# Patient Record
Sex: Female | Born: 1960
Health system: Southern US, Community
[De-identification: ages and names within clinical notes are randomized; demographics above are authoritative.]

## PROBLEM LIST (undated history)

## (undated) DIAGNOSIS — R7303 Prediabetes: Secondary | ICD-10-CM

## (undated) DIAGNOSIS — K589 Irritable bowel syndrome without diarrhea: Secondary | ICD-10-CM

## (undated) DIAGNOSIS — G4700 Insomnia, unspecified: Secondary | ICD-10-CM

## (undated) DIAGNOSIS — N6019 Diffuse cystic mastopathy of unspecified breast: Secondary | ICD-10-CM

## (undated) DIAGNOSIS — E785 Hyperlipidemia, unspecified: Secondary | ICD-10-CM

## (undated) DIAGNOSIS — K219 Gastro-esophageal reflux disease without esophagitis: Secondary | ICD-10-CM

## (undated) DIAGNOSIS — F418 Other specified anxiety disorders: Secondary | ICD-10-CM

## (undated) DIAGNOSIS — N809 Endometriosis, unspecified: Secondary | ICD-10-CM

## (undated) HISTORY — DX: Endometriosis, unspecified: N80.9

## (undated) HISTORY — DX: Prediabetes: R73.03

## (undated) HISTORY — PX: COLPOSCOPY: SHX161

## (undated) HISTORY — DX: Diffuse cystic mastopathy of unspecified breast: N60.19

## (undated) HISTORY — DX: Insomnia, unspecified: G47.00

## (undated) HISTORY — PX: COLONOSCOPY: SHX174

## (undated) HISTORY — DX: Hyperlipidemia, unspecified: E78.5

## (undated) HISTORY — DX: Irritable bowel syndrome, unspecified: K58.9

## (undated) HISTORY — DX: Other specified anxiety disorders: F41.8

---
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
## 1991-07-04 HISTORY — PX: OTHER SURGICAL HISTORY: SHX169

## 1998-07-03 HISTORY — PX: CERVIX LESION DESTRUCTION: SHX591

## 2000-12-06 ENCOUNTER — Encounter: Payer: Self-pay | Admitting: Gastroenterology

## 2000-12-06 ENCOUNTER — Encounter: Admission: RE | Admit: 2000-12-06 | Discharge: 2000-12-06 | Payer: Self-pay | Admitting: Gastroenterology

## 2000-12-19 ENCOUNTER — Ambulatory Visit (HOSPITAL_COMMUNITY): Admission: RE | Admit: 2000-12-19 | Discharge: 2000-12-19 | Payer: Self-pay | Admitting: Gastroenterology

## 2004-05-02 ENCOUNTER — Encounter: Admission: RE | Admit: 2004-05-02 | Discharge: 2004-05-02 | Payer: Self-pay | Admitting: Unknown Physician Specialty

## 2004-05-16 ENCOUNTER — Ambulatory Visit (HOSPITAL_COMMUNITY): Admission: RE | Admit: 2004-05-16 | Discharge: 2004-05-16 | Payer: Self-pay | Admitting: Family Medicine

## 2006-05-11 ENCOUNTER — Ambulatory Visit (HOSPITAL_COMMUNITY): Admission: RE | Admit: 2006-05-11 | Discharge: 2006-05-11 | Payer: Self-pay | Admitting: Family Medicine

## 2006-07-10 ENCOUNTER — Ambulatory Visit (HOSPITAL_COMMUNITY): Admission: RE | Admit: 2006-07-10 | Discharge: 2006-07-10 | Payer: Self-pay | Admitting: Family Medicine

## 2006-09-11 ENCOUNTER — Ambulatory Visit (HOSPITAL_COMMUNITY): Admission: RE | Admit: 2006-09-11 | Discharge: 2006-09-12 | Payer: Self-pay | Admitting: Orthopedic Surgery

## 2006-10-10 ENCOUNTER — Encounter: Payer: Self-pay | Admitting: Orthopedic Surgery

## 2006-11-01 ENCOUNTER — Encounter: Payer: Self-pay | Admitting: Orthopedic Surgery

## 2006-11-21 ENCOUNTER — Ambulatory Visit (HOSPITAL_BASED_OUTPATIENT_CLINIC_OR_DEPARTMENT_OTHER): Admission: RE | Admit: 2006-11-21 | Discharge: 2006-11-21 | Payer: Self-pay | Admitting: Orthopedic Surgery

## 2006-11-22 ENCOUNTER — Encounter: Payer: Self-pay | Admitting: Orthopedic Surgery

## 2006-12-02 ENCOUNTER — Encounter: Payer: Self-pay | Admitting: Orthopedic Surgery

## 2007-01-01 ENCOUNTER — Encounter: Payer: Self-pay | Admitting: Orthopedic Surgery

## 2007-02-01 ENCOUNTER — Encounter: Payer: Self-pay | Admitting: Orthopedic Surgery

## 2007-05-09 ENCOUNTER — Encounter: Payer: Self-pay | Admitting: Orthopedic Surgery

## 2007-06-03 ENCOUNTER — Encounter: Payer: Self-pay | Admitting: Orthopedic Surgery

## 2007-07-04 ENCOUNTER — Encounter: Payer: Self-pay | Admitting: Orthopedic Surgery

## 2007-07-04 HISTORY — PX: SHOULDER ARTHROSCOPY: SHX128

## 2007-11-19 ENCOUNTER — Ambulatory Visit (HOSPITAL_COMMUNITY): Admission: RE | Admit: 2007-11-19 | Discharge: 2007-11-19 | Payer: Self-pay | Admitting: Orthopedic Surgery

## 2007-11-20 ENCOUNTER — Encounter: Payer: Self-pay | Admitting: Orthopedic Surgery

## 2007-12-02 ENCOUNTER — Encounter: Payer: Self-pay | Admitting: Orthopedic Surgery

## 2008-01-01 ENCOUNTER — Encounter: Payer: Self-pay | Admitting: Orthopedic Surgery

## 2008-01-28 ENCOUNTER — Ambulatory Visit (HOSPITAL_COMMUNITY): Admission: RE | Admit: 2008-01-28 | Discharge: 2008-01-28 | Payer: Self-pay | Admitting: Family Medicine

## 2008-01-29 ENCOUNTER — Emergency Department (HOSPITAL_COMMUNITY): Admission: EM | Admit: 2008-01-29 | Discharge: 2008-01-29 | Payer: Self-pay | Admitting: Emergency Medicine

## 2008-07-24 ENCOUNTER — Ambulatory Visit (HOSPITAL_COMMUNITY): Admission: RE | Admit: 2008-07-24 | Discharge: 2008-07-24 | Payer: Self-pay | Admitting: Family Medicine

## 2010-01-31 ENCOUNTER — Emergency Department (HOSPITAL_COMMUNITY): Admission: EM | Admit: 2010-01-31 | Discharge: 2010-01-31 | Payer: Self-pay | Admitting: Emergency Medicine

## 2010-06-02 ENCOUNTER — Emergency Department (HOSPITAL_COMMUNITY)
Admission: EM | Admit: 2010-06-02 | Discharge: 2010-06-02 | Payer: Self-pay | Source: Home / Self Care | Admitting: Emergency Medicine

## 2010-07-24 ENCOUNTER — Encounter: Payer: Self-pay | Admitting: Family Medicine

## 2010-09-16 LAB — POCT CARDIAC MARKERS: Troponin i, poc: <0.05 ng/mL (ref 0.00–0.09)

## 2010-09-16 LAB — CBC
Hemoglobin: 12.6 g/dL (ref 12.0–15.0)
MCH: 32.3 pg (ref 26.0–34.0)
MCHC: 34.3 g/dL (ref 30.0–36.0)
Platelets: ADEQUATE 10*3/uL (ref 150–400)

## 2010-09-16 LAB — DIFFERENTIAL
Basophils Relative: 1 % (ref 0–1)
Eosinophils Absolute: 0.1 10*3/uL (ref 0.0–0.7)
Monocytes Relative: 8 % (ref 3–12)
Neutrophils Relative %: 56 % (ref 43–77)

## 2010-09-16 LAB — BASIC METABOLIC PANEL
BUN: 17 mg/dL (ref 6–23)
CO2: 24 mEq/L (ref 19–32)
Calcium: 9.2 mg/dL (ref 8.4–10.5)
Creatinine, Ser: 0.83 mg/dL (ref 0.4–1.2)
Glucose, Bld: 93 mg/dL (ref 70–99)

## 2010-10-13 ENCOUNTER — Other Ambulatory Visit (HOSPITAL_COMMUNITY): Payer: Self-pay | Admitting: *Deleted

## 2010-10-13 DIAGNOSIS — Z139 Encounter for screening, unspecified: Secondary | ICD-10-CM

## 2010-10-20 ENCOUNTER — Ambulatory Visit (HOSPITAL_COMMUNITY)
Admission: RE | Admit: 2010-10-20 | Discharge: 2010-10-20 | Disposition: A | Discharge status: Home / Self Care | Payer: BC Managed Care – PPO | Source: Ambulatory Visit | Attending: Family Medicine | Admitting: Family Medicine

## 2010-10-20 DIAGNOSIS — Z1231 Encounter for screening mammogram for malignant neoplasm of breast: Secondary | ICD-10-CM | POA: Insufficient documentation

## 2010-10-20 DIAGNOSIS — Z139 Encounter for screening, unspecified: Secondary | ICD-10-CM

## 2010-11-15 NOTE — Op Note (Signed)
[name], [name]              ACCOUNT NO.:  [id number]   MEDICAL RECORD NO.:  [id number]          PATIENT TYPE:  AMB   LOCATION:  [facility]                         FACILITY:  [facility]   PHYSICIAN:  Marlowe Kays, M.D.  DATE OF BIRTH:  [birth year]   DATE OF PROCEDURE:  11/21/2006  DATE OF DISCHARGE:                               OPERATIVE REPORT   PREOPERATIVE DIAGNOSIS:  Adhesive capsulitis right shoulder, status post  right shoulder arthroscopy.   POSTOPERATIVE DIAGNOSIS:  Adhesive capsulitis right shoulder, status  post right shoulder arthroscopy.   OPERATION:  Closed manipulation right shoulder with intra-articular  injection of Depo-Medrol and Marcaine.   SURGEON:  Marlowe Kays, M.D.   ASSISTANT:  Nurse.   ANESTHESIA:  General anesthesia.   PATHOLOGY AND JUSTIFICATION FOR PROCEDURE:  Original surgery was roughly  10 weeks ago.  She has had persistence of significant loss of motion  despite physical therapy.  Preoperatively she had about 50% of normal  motion under anesthetic as described below.   PROCEDURE:  A time-out was performed, satisfactory general anesthesia,  preliminary range of motion under anesthesia was abduction to 45  degrees, flexion to 60, internal rotation to 45 and external rotation to  20.  With gentle stress and audible and palpable lysis of adhesions, I  went through abduction, flexion, internal rotation and external rotation  with the conclusion of the motion being normal with 135 degrees of  abduction, 180 degrees of flexion/extension and 90 degrees of rotation.  I then prepped the subacromial area and injected 10 mL 0.50% Marcaine  plain and 40 mg of Depo-Medrol.  And the time of this dictation, she was  being awakened and on her way to the recovery room in satisfactory  condition with no known complications.           ______________________________  Marlowe Kays, M.D.     JA/MEDQ  D:  11/21/2006  T:  11/21/2006  Job:  [id number]

## 2010-11-15 NOTE — Op Note (Signed)
[name], [name]              ACCOUNT NO.:  [id number]   MEDICAL RECORD NO.:  [id number]          PATIENT TYPE:  AMB   LOCATION:  DAY                          FACILITY:  [facility]   PHYSICIAN:  Marlowe Kays, M.D.  DATE OF BIRTH:  05-Jan-1961   DATE OF PROCEDURE:  DATE OF DISCHARGE:                               OPERATIVE REPORT   PREOPERATIVE DIAGNOSIS:  Adhesive capsulitis, left shoulder.   POSTOPERATIVE DIAGNOSIS:  Adhesive capsulitis, left shoulder.   OPERATION:  Closed manipulation, left shoulder, and subacromial  injection with Marcaine and steroid.   SURGEON:  Marlowe Kays, MD.   ASSISTANT:  None.   ANESTHESIA:  General.   JUSTIFICATION FOR PROCEDURE:  She had a nonsurgical MRI and had  substantial restriction of shoulder motion refractory to physical  therapy and consequently she is here today for the above-mentioned  procedure.   PROCEDURE:  After satisfactory general anesthesia, time-out was  performed.  Preoperative measurements were abduction 45 degrees, flexion  85, internal rotation of 30, and external rotation of 30.  I then  performed gentle but forceful range of motion stressing on her shoulder,  going first in abduction and then to flexion, and then external rotation  and then internal rotation.  Towards the final end point there were some  audible and palpable lysis of adhesions.  Final motion was abduction to  135 degrees, flexion to 180, internal rotation to 75, and external  rotation to 60.  I then prepped the subacromial area of her lateral  shoulder and injected beneath the acromion with 10 mL of 0.5% Marcaine  without adrenaline and 2 mL of Kenalog.  She was then awakened, and  taken to recovery in satisfactory condition with no known complications.           ______________________________  Marlowe Kays, M.D.     JA/MEDQ  D:  11/19/2007  T:  11/19/2007  Job:  [id number]

## 2010-11-18 NOTE — Op Note (Signed)
[name], [name]              ACCOUNT NO.:  [id number]   MEDICAL RECORD NO.:  [id number]          PATIENT TYPE:  OIB   LOCATION:  1504                         FACILITY:  [facility]   PHYSICIAN:  Marlowe Kays, M.D.  DATE OF BIRTH:  02-Mar-1961   DATE OF PROCEDURE:  09/11/2006  DATE OF DISCHARGE:  09/12/2006                               OPERATIVE REPORT   PREOPERATIVE DIAGNOSES:  1. Chronic impingement syndrome with rotator cuff and biceps      tendinopathy.  2. Degenerative arthritis acromioclavicular joint right shoulder.   POSTOPERATIVE DIAGNOSES:  1. Chronic impingement syndrome with rotator cuff tendinopathy.  2. Degenerative arthritis acromioclavicular joint right shoulder.   OPERATION:  1. Right shoulder arthroscopy (normal examination).  2. Arthroscopic subacromial decompression.  3. Open distal clavicle resection.   SURGEON:  Dr. Simonne Come   ASSISTANT:  Mr. Adrian Blackwater.   ANESTHESIA:  General.   PATHOLOGY AND JUSTIFICATION FOR PROCEDURE:  She has had a painful right  shoulder with an MRI demonstrating the above diagnoses.  Her AC joint  was quite tender with spurring on the underneath surface.   PROCEDURE:  Prophylactic antibiotics, satisfactory general anesthesia,  beach-chair position on the Humbird frame.  Right shoulder girdle was  prepped with DuraPrep, draped in a sterile field.  The anatomy of the  shoulder joint was marked out and tentative incision for the distal  clavicle resection, the subacromial space, and posterior and lateral  portals were all infiltrated with 0.5% Marcaine with adrenalin.  Through  a posterior soft spot portal, I atraumatically entered the glenohumeral  joint which was unremarkable on examination.  Documentary pictures were  taken.  I then redirected the scope into the subacromial space.  She had  a good bit of bursal tissue present.  Through a lateral portal, I used a  4.2 shaver to clean out the space.  I then brought in the  ArthroCare 90  degree vaporizer and coagulated some bleeders from the bursal resection  and then began removing soft tissue from the undersurface of the type 2  acromion back to the [facility] joint which was identified arthroscopically.  I  then followed with a 4-mm oval bur, burring down the underneath surface  of the distal acromion and went back and forth between the vaporizer,  the bur, and the 4.2 shaver until we had a wide decompression documented  with films with the arm to her side, arm abducted.  Essentially no  bleeding, and I have also inspected the rotator cuff and took a  representative picture of it.  At this point, I evacuated all fluid  possible from the subacromial space, and we made an open incision over  the distal clavicle, identifying the [facility] joint with a needle and  visually.  I then measured 1-1.5 cm from the [facility] joint medialward,  undermining the clavicle at this point and then placed 2 small Homan's.  With the microsaw, I amputated the clavicle at this point and removed it  with towel clip and cautery technique.  I placed bone wax over the raw  bone.  Minimal bleeders were coagulated  with cautery, and I then placed  Gelfoam in the resection space.  I then closed the soft tissues over the  distal clavicle and resected area with interrupted #1 Vicryl in the  fascial  periosteal area, 2-0 Vicryl subcutaneous tissue, Steri-Strips on the  skin with a 4-0 nylon in the 2 portals.  Dry sterile dressing, shoulder  immobilizer were applied.  She tolerated the procedure well and was  taken to recovery in satisfactory condition with no known complications.           ______________________________  Marlowe Kays, M.D.     JA/MEDQ  D:  09/11/2006  T:  09/13/2006  Job:  [id number]

## 2010-12-22 ENCOUNTER — Ambulatory Visit (INDEPENDENT_AMBULATORY_CARE_PROVIDER_SITE_OTHER): Payer: BC Managed Care – PPO | Admitting: Psychiatry

## 2010-12-22 DIAGNOSIS — F329 Major depressive disorder, single episode, unspecified: Secondary | ICD-10-CM

## 2010-12-22 DIAGNOSIS — F411 Generalized anxiety disorder: Secondary | ICD-10-CM

## 2010-12-28 ENCOUNTER — Encounter (HOSPITAL_COMMUNITY): Payer: BC Managed Care – PPO | Admitting: Psychiatry

## 2010-12-30 ENCOUNTER — Encounter (HOSPITAL_COMMUNITY): Payer: BC Managed Care – PPO | Admitting: Psychiatry

## 2011-03-29 LAB — PREGNANCY, URINE: Preg Test, Ur: NEGATIVE

## 2011-03-31 LAB — URINALYSIS, ROUTINE W REFLEX MICROSCOPIC
Bilirubin Urine: NEGATIVE
Glucose, UA: NEGATIVE
Ketones, ur: NEGATIVE
Protein, ur: NEGATIVE

## 2011-03-31 LAB — POCT I-STAT, CHEM 8
BUN: 16
Chloride: 105
Creatinine, Ser: 0.7
Glucose, Bld: 113 — ABNORMAL HIGH
Potassium: 3.7

## 2012-09-05 ENCOUNTER — Ambulatory Visit (INDEPENDENT_AMBULATORY_CARE_PROVIDER_SITE_OTHER): Payer: BC Managed Care – PPO | Admitting: Otolaryngology

## 2012-09-05 DIAGNOSIS — J342 Deviated nasal septum: Secondary | ICD-10-CM

## 2012-09-05 DIAGNOSIS — J343 Hypertrophy of nasal turbinates: Secondary | ICD-10-CM

## 2012-09-11 ENCOUNTER — Encounter: Payer: Self-pay | Admitting: Gastroenterology

## 2012-09-24 ENCOUNTER — Ambulatory Visit: Payer: BC Managed Care – PPO | Admitting: Gastroenterology

## 2012-10-01 ENCOUNTER — Telehealth: Payer: Self-pay | Admitting: Family Medicine

## 2012-10-01 NOTE — Telephone Encounter (Signed)
Is there a medication for night sweats

## 2012-10-01 NOTE — Telephone Encounter (Signed)
Nurse: 4:24PM Patient: [name]  Pharm: [facility] [place]  MSG: Patient currently having the night sweats / they are not getting any better. Per Dr.Scott she was told to advise him that the sweats are not getting better so he can prescribe something for her.

## 2012-10-02 ENCOUNTER — Encounter: Payer: Self-pay | Admitting: Gastroenterology

## 2012-10-02 ENCOUNTER — Telehealth: Payer: Self-pay | Admitting: Family Medicine

## 2012-10-02 NOTE — Telephone Encounter (Signed)
Patient left voice mail for me that she was seen by Dr. Fransico Him - endocrin on 10/01/12

## 2012-10-03 ENCOUNTER — Ambulatory Visit (INDEPENDENT_AMBULATORY_CARE_PROVIDER_SITE_OTHER): Payer: BC Managed Care – PPO | Admitting: Otolaryngology

## 2012-10-04 NOTE — Telephone Encounter (Signed)
4 menopause related symptoms such as night sweats there are many great choices. What is commonly used his low dose serotonin reuptake inhibitors. Examples of this would be Celexa, Lexapro, Prozac. These would be used and a low dose. That gives mild to moderate relief in a significant portion of/night sweats suffers. Please talk with the patient and see if they would like to try one of these I will be happy to call it in for them.

## 2012-10-07 NOTE — Telephone Encounter (Signed)
Patient stated she is already on Lexapro 10mg  daily with no relief. FYI: Patient said she saw Dr. Fransico Him for diabetes and he increased her metformin to 2000 mg bid but it made her very, very sick and she called his office to get meds changed and will let us know his new changes.

## 2012-10-10 ENCOUNTER — Ambulatory Visit: Payer: BC Managed Care – PPO | Admitting: Gastroenterology

## 2012-10-17 ENCOUNTER — Ambulatory Visit (INDEPENDENT_AMBULATORY_CARE_PROVIDER_SITE_OTHER): Payer: BC Managed Care – PPO | Admitting: Otolaryngology

## 2012-10-21 ENCOUNTER — Encounter: Payer: Self-pay | Admitting: *Deleted

## 2012-10-24 ENCOUNTER — Encounter: Payer: Self-pay | Admitting: *Deleted

## 2012-10-29 ENCOUNTER — Encounter: Payer: Self-pay | Admitting: Gastroenterology

## 2012-10-29 ENCOUNTER — Ambulatory Visit (INDEPENDENT_AMBULATORY_CARE_PROVIDER_SITE_OTHER): Payer: BC Managed Care – PPO | Admitting: Gastroenterology

## 2012-10-29 VITALS — BP 110/62 | HR 64 | Ht 65.0 in | Wt 176.2 lb

## 2012-10-29 DIAGNOSIS — R141 Gas pain: Secondary | ICD-10-CM

## 2012-10-29 DIAGNOSIS — K589 Irritable bowel syndrome without diarrhea: Secondary | ICD-10-CM

## 2012-10-29 DIAGNOSIS — D649 Anemia, unspecified: Secondary | ICD-10-CM

## 2012-10-29 DIAGNOSIS — R140 Abdominal distension (gaseous): Secondary | ICD-10-CM

## 2012-10-29 MED ORDER — LINACLOTIDE 145 MCG PO CAPS: 145.0000 ug | ORAL_CAPSULE | Freq: Every day | ORAL | Status: DC

## 2012-10-29 MED ORDER — MOVIPREP 100 G PO SOLR: 1.0000 | Freq: Once | ORAL | Status: DC

## 2012-10-29 NOTE — Patient Instructions (Addendum)
  You have been scheduled for a colonoscopy with propofol. Please follow written instructions given to you at your visit today.  Please pick up your prep kit at the pharmacy within the next 1-3 days. If you use inhalers (even only as needed), please bring them with you on the day of your procedure. Your physician has requested that you go to www.startemmi.com and enter the access code given to you at your visit today. This web site gives a general overview about your procedure. However, you should still follow specific instructions given to you by our office regarding your preparation for the procedure.  Please follow diabetic instructions as well.  Samples of Linzess was given today. Please take one tablet by mouth once daily. If this works well for you, please call back for a prescription.  FOD-MAP diet given for you to review. ____________________________________________________________________________________________________________________________                                               We are excited to introduce MyChart, a new best-in-class service that provides you online access to important information in your electronic medical record. We want to make it easier for you to view your health information - all in one secure location - when and where you need it. We expect MyChart will enhance the quality of care and service we provide.  When you register for MyChart, you can:    View your test results.    Request appointments and receive appointment reminders via email.    Request medication renewals.    View your medical history, allergies, medications and immunizations.    Communicate with your physician's office through a password-protected site.    Conveniently print information such as your medication lists.  To find out if MyChart is right for you, please talk to a member of our clinical staff today. We will gladly answer your questions about this free health and  wellness tool.  If you are age 52 or older and want a member of your family to have access to your record, you must provide written consent by completing a proxy form available at our office. Please speak to our clinical staff about guidelines regarding accounts for patients younger than age 70.  As you activate your MyChart account and need any technical assistance, please call the MyChart technical support line at [phone]) or email your question to [email or web address]. If you email your question(s), please include your name, a return phone number and the best time to reach you.  If you have non-urgent health-related questions, you can send a message to our office through MyChart at [email or web address]. If you have a medical emergency, call 911.  Thank you for using MyChart as your new health and wellness resource!   MyChart licensed from [employer],  4540-9811. Patents Pending.

## 2012-10-29 NOTE — Progress Notes (Addendum)
History of Present Illness:  This is a very pleasant 52 year old Caucasian female who has a long history of IBS with previous colonoscopy in 2002 showing a very long and redundant colon.  She currently describes intermittent left lower quadrant pain, gas, bloating, excessive flatus, and some difficulty with stool evacuation.  She has mild anemia but iron studies were normal.  Also celiac serologies were obtained by Dr. Lilyan Punt and were negative.  The patient denies any specific food intolerances, and does not use sorbitol or fructose ,or lactose in her diet.  She has gained 35 pounds over the last 2 years.  There is no history of melena, hematochezia, upper GI or hepatobiliary complaints.  She does have mild diabetes and is currently on metformin 500 mg daily, Lexapro 10 mg a day, and Pravachol 20 mg a day.  Family history is noncontributory.  The patient relates she's had regular gynecologic exams which have been unremarkable.  She is postmenopausal.   I have reviewed this patient's present history, medical and surgical past history, allergies and medications.     ROS:     Physical Exam: Blood pressure 110/62, pulse 64 and regular and weight 176 with a BMI of 29.32. General well developed well nourished patient in no acute distress, appearing their stated age Eyes PERRLA, no icterus, fundoscopic exam per opthamologist Skin no lesions noted Neck supple, no adenopathy, no thyroid enlargement, no tenderness Chest clear to percussion and auscultation Heart no significant murmurs, gallops or rubs noted Abdomen no hepatosplenomegaly masses or tenderness, BS normal.  Rectal inspection normal no fissures, or fistulae noted.  No masses or tenderness on digital exam. Stool guaiac negative. Extremities no acute joint lesions, edema, phlebitis or evidence of cellulitis. Neurologic patient oriented x 3, cranial nerves intact, no focal neurologic deficits noted. Psychological mental status normal and  normal affect.  Assessment and plan: Middle-aged patient with a long history of IBS related to her very long, redundant, tortuous colon.  She does need colonoscopy screening because of her age.  Stool today was negative for occult blood, and her iron levels were normal.  I have placed her on Linzess and 45 mcg a day,FOD-MAP diet for IBS and schedule colonoscopy exam  No diagnosis found.

## 2012-11-15 ENCOUNTER — Ambulatory Visit (AMBULATORY_SURGERY_CENTER): Payer: BC Managed Care – PPO | Admitting: Gastroenterology

## 2012-11-15 ENCOUNTER — Other Ambulatory Visit: Payer: Self-pay | Admitting: Gastroenterology

## 2012-11-15 ENCOUNTER — Encounter: Payer: Self-pay | Admitting: Gastroenterology

## 2012-11-15 VITALS — BP 123/67 | HR 57 | Temp 97.9°F | Resp 37 | Ht 65.0 in | Wt 176.0 lb

## 2012-11-15 DIAGNOSIS — D649 Anemia, unspecified: Secondary | ICD-10-CM

## 2012-11-15 DIAGNOSIS — R141 Gas pain: Secondary | ICD-10-CM

## 2012-11-15 DIAGNOSIS — Z1211 Encounter for screening for malignant neoplasm of colon: Secondary | ICD-10-CM

## 2012-11-15 DIAGNOSIS — D126 Benign neoplasm of colon, unspecified: Secondary | ICD-10-CM

## 2012-11-15 DIAGNOSIS — R142 Eructation: Secondary | ICD-10-CM

## 2012-11-15 MED ORDER — SODIUM CHLORIDE 0.9 % IV SOLN: 500.0000 mL | INTRAVENOUS | Status: DC

## 2012-11-15 NOTE — Progress Notes (Signed)
Called to room to assist during endoscopic procedure.  Patient ID and intended procedure confirmed with present staff. Received instructions for my participation in the procedure from the performing physician.  

## 2012-11-15 NOTE — Progress Notes (Signed)
On admission bld sugar =71 asymptomatic ,D5W hung and after 300 cc infused bld sugar =135 . Informed B.Gordy Levan CRNA and NS 1000 cc hung IV as instructed.

## 2012-11-15 NOTE — Progress Notes (Signed)
Patient did not have preoperative order for IV antibiotic SSI prophylaxis. (G8918)Patient did not have preoperative order for IV antibiotic SSI prophylaxis. (G8918)Patient did not experience any of the following events: a burn prior to discharge; a fall within the facility; wrong site/side/patient/procedure/implant event; or a hospital transfer or hospital admission upon discharge from the facility. (G8907) 

## 2012-11-15 NOTE — Op Note (Signed)
[facility] [address] [place], [address]   COLONOSCOPY PROCEDURE REPORT  PATIENT: [name], [name]  MR#: [id number] BIRTHDATE: [birth year] , 51  yrs. old GENDER: Female ENDOSCOPIST: Mardella Layman, MD, [facility] REFERRED BY: PROCEDURE DATE:  11/15/2012 PROCEDURE:   Colonoscopy with snare polypectomy ASA CLASS:   Class II INDICATIONS:Colorectal cancer screening. MEDICATIONS: Propofol (Diprivan) 370  DESCRIPTION OF PROCEDURE:   After the risks and benefits and of the procedure were explained, informed consent was obtained.  A digital rectal exam revealed no abnormalities of the rectum.    The LB [id number]  endoscope was introduced through the anus and advanced to the cecum, which was identified by both the appendix and ileocecal valve .  The quality of the prep was excellent, using MoviPrep .  The instrument was then slowly withdrawn as the colon was fully examined.     COLON FINDINGS: A medium sized polypoid shaped and smooth flat polyp was found at the hepatic flexure.  A polypectomy was performed using snare cautery.  The resection was complete and the polyp tissue was completely retrieved.   The colon was otherwise normal. There was no diverticulosis, inflammation, polyps or cancers unless previously stated.     Retroflexed views revealed no abnormalities. The scope was then withdrawn from the patient and the procedure completed.  COMPLICATIONS: There were no complications. ENDOSCOPIC IMPRESSION: 1.   Medium sized flat polyp was found at the hepatic flexure; polypectomy was performed using snare cautery 2.   The colon was otherwise normal 3.   Perianal papillae noted,   RECOMMENDATIONS: 1.  Await pathology results 2.  Repeat colonoscopy in 5 years if polyp adenomatous; otherwise 10 years 3.  Continue current medications   REPEAT EXAM:  cc:  _______________________________ eSignedMardella Layman, MD, [facility] 11/15/2012 2:09  PM

## 2012-11-15 NOTE — Patient Instructions (Addendum)
Discharge instructions given with verbal understanding. Handout on polyps given. Resume previous medications. YOU HAD AN ENDOSCOPIC PROCEDURE TODAY AT THE [facility]: Refer to the procedure report that was given to you for any specific questions about what was found during the examination.  If the procedure report does not answer your questions, please call your gastroenterologist to clarify.  If you requested that your care partner not be given the details of your procedure findings, then the procedure report has been included in a sealed envelope for you to review at your convenience later.  YOU SHOULD EXPECT: Some feelings of bloating in the abdomen. Passage of more gas than usual.  Walking can help get rid of the air that was put into your GI tract during the procedure and reduce the bloating. If you had a lower endoscopy (such as a colonoscopy or flexible sigmoidoscopy) you may notice spotting of blood in your stool or on the toilet paper. If you underwent a bowel prep for your procedure, then you may not have a normal bowel movement for a few days.  DIET: Your first meal following the procedure should be a light meal and then it is ok to progress to your normal diet.  A half-sandwich or bowl of soup is an example of a good first meal.  Heavy or fried foods are harder to digest and may make you feel nauseous or bloated.  Likewise meals heavy in dairy and vegetables can cause extra gas to form and this can also increase the bloating.  Drink plenty of fluids but you should avoid alcoholic beverages for 24 hours.  ACTIVITY: Your care partner should take you home directly after the procedure.  You should plan to take it easy, moving slowly for the rest of the day.  You can resume normal activity the day after the procedure however you should NOT DRIVE or use heavy machinery for 24 hours (because of the sedation medicines used during the test).    SYMPTOMS TO REPORT IMMEDIATELY: A  gastroenterologist can be reached at any hour.  During normal business hours, 8:30 AM to 5:00 PM Monday through Friday, call [phone].  After hours and on weekends, please call the GI answering service at [phone] who will take a message and have the physician on call contact you.   Following lower endoscopy (colonoscopy or flexible sigmoidoscopy):  Excessive amounts of blood in the stool  Significant tenderness or worsening of abdominal pains  Swelling of the abdomen that is new, acute  Fever of 100F or higher  FOLLOW UP: If any biopsies were taken you will be contacted by phone or by letter within the next 1-3 weeks.  Call your gastroenterologist if you have not heard about the biopsies in 3 weeks.  Our staff will call the home number listed on your records the next business day following your procedure to check on you and address any questions or concerns that you may have at that time regarding the information given to you following your procedure. This is a courtesy call and so if there is no answer at the home number and we have not heard from you through the emergency physician on call, we will assume that you have returned to your regular daily activities without incident.  SIGNATURES/CONFIDENTIALITY: You and/or your care partner have signed paperwork which will be entered into your electronic medical record.  These signatures attest to the fact that that the information above on your After Visit Summary has   been reviewed and is understood.  Full responsibility of the confidentiality of this discharge information lies with you and/or your care-partner. 

## 2012-11-18 ENCOUNTER — Telehealth: Payer: Self-pay | Admitting: *Deleted

## 2012-11-18 NOTE — Telephone Encounter (Signed)
  Follow up Call-  Call back number 11/15/2012  Post procedure Call Back phone  # [phone]  Permission to leave phone message Yes     No answer,left message.

## 2012-11-22 ENCOUNTER — Encounter: Payer: Self-pay | Admitting: Gastroenterology

## 2013-01-07 ENCOUNTER — Encounter: Payer: Self-pay | Admitting: Family Medicine

## 2013-01-07 ENCOUNTER — Ambulatory Visit (INDEPENDENT_AMBULATORY_CARE_PROVIDER_SITE_OTHER): Payer: BC Managed Care – PPO | Admitting: Family Medicine

## 2013-01-07 VITALS — BP 118/72 | Temp 98.4°F | Wt 172.0 lb

## 2013-01-07 DIAGNOSIS — J019 Acute sinusitis, unspecified: Secondary | ICD-10-CM

## 2013-01-07 DIAGNOSIS — J209 Acute bronchitis, unspecified: Secondary | ICD-10-CM

## 2013-01-07 MED ORDER — LEVOFLOXACIN 500 MG PO TABS: 500.0000 mg | ORAL_TABLET | Freq: Every day | ORAL | Status: AC

## 2013-01-07 MED ORDER — BENZONATATE 100 MG PO CAPS: 100.0000 mg | ORAL_CAPSULE | Freq: Four times a day (QID) | ORAL | Status: DC | PRN

## 2013-01-07 NOTE — Progress Notes (Signed)
  Subjective:    Patient ID: [name], female    DOB: [birth year], 52 y.o.   MRN: [id number]  Cough This is a new problem. The current episode started in the past 7 days. Associated symptoms include a fever and a sore throat. Associated symptoms comments: Body aches. She has tried OTC cough suppressant for the symptoms. The treatment provided no relief.   PMH benign, prediabetes   Review of Systems  Constitutional: Positive for fever.  HENT: Positive for sore throat.   Respiratory: Positive for cough.        Objective:   Physical Exam Lungs clear heart regular neck no masses ears normal sinus nontender       Assessment & Plan:  Probable acute bronchitis with possibility of acute sinusitis-Levaquin daily for 10 days, Tessalon 3 times a day when necessary call if ongoing troubles warning signs were discussed

## 2013-01-08 ENCOUNTER — Encounter: Payer: Self-pay | Admitting: *Deleted

## 2013-01-29 ENCOUNTER — Ambulatory Visit (INDEPENDENT_AMBULATORY_CARE_PROVIDER_SITE_OTHER): Payer: BC Managed Care – PPO | Admitting: Family Medicine

## 2013-01-29 ENCOUNTER — Encounter: Payer: Self-pay | Admitting: Family Medicine

## 2013-01-29 VITALS — BP 108/68 | HR 70 | Wt 174.0 lb

## 2013-01-29 DIAGNOSIS — Z79899 Other long term (current) drug therapy: Secondary | ICD-10-CM

## 2013-01-29 DIAGNOSIS — R7303 Prediabetes: Secondary | ICD-10-CM | POA: Insufficient documentation

## 2013-01-29 DIAGNOSIS — E785 Hyperlipidemia, unspecified: Secondary | ICD-10-CM | POA: Insufficient documentation

## 2013-01-29 DIAGNOSIS — R600 Localized edema: Secondary | ICD-10-CM

## 2013-01-29 DIAGNOSIS — R7309 Other abnormal glucose: Secondary | ICD-10-CM

## 2013-01-29 DIAGNOSIS — R609 Edema, unspecified: Secondary | ICD-10-CM

## 2013-01-29 MED ORDER — PRAVASTATIN SODIUM 20 MG PO TABS: 20.0000 mg | ORAL_TABLET | Freq: Every day | ORAL | Status: DC

## 2013-01-29 MED ORDER — METFORMIN HCL 500 MG PO TABS: 500.0000 mg | ORAL_TABLET | Freq: Two times a day (BID) | ORAL | Status: DC

## 2013-01-29 NOTE — Progress Notes (Signed)
  Subjective:    Patient ID: [name], female    DOB: [birth year], 52 y.o.   MRN: [id number]  HPIbilateral ankle and foot swelling. Started last Friday. Patient went to the beach she was out in the sun fair amount of time for several days in a row. She denies excessive salt intake denies elevated blood pressure no headaches wheezing difficulty breathing. No nausea vomiting. She relates compliance with her medicine but was off of her metformin for one week. She is having a hard time getting back in with the endocrinologist. Has history of prediabetes and hyperlipidemia Family history noncontributory social does not smoke  Review of Systems See above    Objective:   Physical Exam Lungs are clear no crackles no wheezes pulses are normal blood pressure good extremities trace edema       Assessment & Plan:  Trace edema at best I think this is related to the excessive amount of sun. I don't think that this is a sign of any renal or liver problems we will be checking lab work. If significant swelling occurs in the future notify us we can use when necessary Lasix with potassium  Hyperlipidemia check lipid profile continue medication  Prediabetes-continue metformin watch diet exercise check hemoglobin A1c. Followup 6 months

## 2013-08-26 ENCOUNTER — Ambulatory Visit: Payer: BC Managed Care – PPO | Admitting: Family Medicine

## 2013-09-25 ENCOUNTER — Other Ambulatory Visit: Payer: Self-pay | Admitting: Family Medicine

## 2013-11-05 ENCOUNTER — Other Ambulatory Visit: Payer: Self-pay | Admitting: Family Medicine

## 2014-03-24 ENCOUNTER — Telehealth: Payer: Self-pay | Admitting: Family Medicine

## 2014-03-24 NOTE — Telephone Encounter (Signed)
escitalopram (LEXAPRO) 10 MG tablet  Pt currently take this med at this dose, was seen yesterday by her  Therapist at which point they suggested she increase her Lexapro Script by a few mg.   [place] willing to have her therapist send a note stating why she feels she  needs to increase her dose

## 2014-03-25 NOTE — Telephone Encounter (Signed)
Checking on the status of this note

## 2014-03-25 NOTE — Telephone Encounter (Signed)
Increase medication to 20 mg, 30 day supply with 4 refills, followup within 4-6 weeks

## 2014-03-26 MED ORDER — ESCITALOPRAM OXALATE 20 MG PO TABS: 20.0000 mg | ORAL_TABLET | Freq: Every day | ORAL | Status: DC

## 2014-03-26 NOTE — Telephone Encounter (Signed)
Medication sent to pharmacy. Left message on voicemail notifying patient.  

## 2014-09-21 ENCOUNTER — Encounter: Payer: Self-pay | Admitting: Nurse Practitioner

## 2014-09-21 ENCOUNTER — Ambulatory Visit (INDEPENDENT_AMBULATORY_CARE_PROVIDER_SITE_OTHER): Payer: BC Managed Care – PPO | Admitting: Nurse Practitioner

## 2014-09-21 ENCOUNTER — Other Ambulatory Visit: Payer: Self-pay

## 2014-09-21 ENCOUNTER — Ambulatory Visit (HOSPITAL_COMMUNITY)
Admission: RE | Admit: 2014-09-21 | Discharge: 2014-09-21 | Disposition: A | Discharge status: Home / Self Care | Payer: BC Managed Care – PPO | Source: Ambulatory Visit | Attending: Nurse Practitioner | Admitting: Nurse Practitioner

## 2014-09-21 VITALS — BP 128/84 | Ht 66.0 in | Wt 183.4 lb

## 2014-09-21 DIAGNOSIS — G4700 Insomnia, unspecified: Secondary | ICD-10-CM | POA: Diagnosis not present

## 2014-09-21 DIAGNOSIS — M79642 Pain in left hand: Secondary | ICD-10-CM | POA: Diagnosis present

## 2014-09-21 DIAGNOSIS — W010XXA Fall on same level from slipping, tripping and stumbling without subsequent striking against object, initial encounter: Secondary | ICD-10-CM | POA: Insufficient documentation

## 2014-09-21 DIAGNOSIS — S62645A Nondisplaced fracture of proximal phalanx of left ring finger, initial encounter for closed fracture: Secondary | ICD-10-CM | POA: Insufficient documentation

## 2014-09-21 DIAGNOSIS — S60222A Contusion of left hand, initial encounter: Secondary | ICD-10-CM | POA: Diagnosis not present

## 2014-09-21 DIAGNOSIS — S62609D Fracture of unspecified phalanx of unspecified finger, subsequent encounter for fracture with routine healing: Secondary | ICD-10-CM

## 2014-09-21 MED ORDER — ALPRAZOLAM 0.5 MG PO TABS: 0.5000 mg | ORAL_TABLET | Freq: Every evening | ORAL | Status: DC | PRN

## 2014-09-21 NOTE — Progress Notes (Signed)
Subjective:  Presents for c/o pain in the left ring and small fingers that started about a month ago when she fell after tripping over her dog. Describes hyperextension of the fingers. "Popped" them back into place. Immediate swelling. Much better except for those 2 fingers. Also doing well on Lexapro. Main issue is insomnia, specifically early am awakenings. Has been under extreme stress dealing with her mother's illness. Has tried Ambien and Lunesta in the past (had sleep walking with Lunesta).  Objective:   BP 128/84 mmHg  Ht 5\' 6"  (1.676 m)  Wt 183 lb 6.4 oz (83.19 kg)  BMI 29.62 kg/m2 NAD. Alert, oriented. Calm affect. Lungs clear. Heart RRR. Good ROM of left wrist without tenderness. Mild edema noted in middle, ring and small fingers with extreme tenderness and decreased range of motion with ring and small fingers.   Assessment: Contusion, hand, left, initial encounter r/o fracture- Plan: DG Hand Complete Left  Insomnia  Plan:  Meds ordered this encounter  Medications  . ALPRAZolam (XANAX) 0.5 MG tablet    Sig: Take 1 tablet (0.5 mg total) by mouth at bedtime as needed for anxiety or sleep.    Dispense:  30 tablet    Refill:  0    Order Specific Question:  Supervising Provider    Answer:  Mikey Kirschner [[id number]]  . HYDROcodone-acetaminophen (NORCO) 7.5-325 MG per tablet    Sig:   . doxycycline (PERIOSTAT) 20 MG tablet    Sig:   . chlorhexidine (PERIDEX) 0.12 % solution    Sig:    Trial of Xanax at bedtime. Contact office if no improvement. Continue Lexapro. Further follow up based on x ray report.

## 2014-09-23 ENCOUNTER — Other Ambulatory Visit: Payer: Self-pay | Admitting: Nurse Practitioner

## 2014-09-23 ENCOUNTER — Encounter: Payer: Self-pay | Admitting: Nurse Practitioner

## 2014-10-15 ENCOUNTER — Other Ambulatory Visit: Payer: Self-pay | Admitting: Family Medicine

## 2014-12-07 ENCOUNTER — Ambulatory Visit (INDEPENDENT_AMBULATORY_CARE_PROVIDER_SITE_OTHER): Payer: BC Managed Care – PPO | Admitting: Nurse Practitioner

## 2014-12-07 ENCOUNTER — Encounter: Payer: Self-pay | Admitting: Nurse Practitioner

## 2014-12-07 VITALS — BP 130/76 | Temp 97.8°F | Ht 66.0 in | Wt 182.5 lb

## 2014-12-07 DIAGNOSIS — K589 Irritable bowel syndrome without diarrhea: Secondary | ICD-10-CM

## 2014-12-07 DIAGNOSIS — J329 Chronic sinusitis, unspecified: Secondary | ICD-10-CM | POA: Diagnosis not present

## 2014-12-07 MED ORDER — AMOXICILLIN-POT CLAVULANATE 875-125 MG PO TABS: 1.0000 | ORAL_TABLET | Freq: Two times a day (BID) | ORAL | Status: DC

## 2014-12-07 NOTE — Patient Instructions (Addendum)
Nasacort AQ Align bowel probiotic Fleets enema Magnesium citrate   Diet and Irritable Bowel Syndrome  No cure has been found for irritable bowel syndrome (IBS). Many options are available to treat the symptoms. Your caregiver will give you the best treatments available for your symptoms. He or she will also encourage you to manage stress and to make changes to your diet. You need to work with your caregiver and Registered Dietician to find the best combination of medicine, diet, counseling, and support to control your symptoms. The following are some diet suggestions. FOODS THAT MAKE IBS WORSE  Fatty foods, such as [place] fries.  Milk products, such as cheese or ice cream.  Chocolate.  Alcohol.  Caffeine (found in coffee and some sodas).  Carbonated drinks, such as soda. If certain foods cause symptoms, you should eat less of them or stop eating them. FOOD JOURNAL   Keep a journal of the foods that seem to cause distress. Write down:  What you are eating during the day and when.  What problems you are having after eating.  When the symptoms occur in relation to your meals.  What foods always make you feel badly.  Take your notes with you to your caregiver to see if you should stop eating certain foods. FOODS THAT MAKE IBS BETTER Fiber reduces IBS symptoms, especially constipation, because it makes stools soft, bulky, and easier to pass. Fiber is found in bran, bread, cereal, beans, fruit, and vegetables. Examples of foods with fiber include:  Apples.  Peaches.  Pears.  Berries.  Figs.  Broccoli, raw.  Cabbage.  Carrots.  Raw peas.  Kidney beans.  Lima beans.  Whole-grain bread.  Whole-grain cereal. Add foods with fiber to your diet a little at a time. This will let your body get used to them. Too much fiber at once might cause gas and swelling of your abdomen. This can trigger symptoms in a person with IBS. Caregivers usually recommend a diet with enough  fiber to produce soft, painless bowel movements. High fiber diets may cause gas and bloating. However, these symptoms often go away within a few weeks, as your body adjusts. In many cases, dietary fiber may lessen IBS symptoms, particularly constipation. However, it may not help pain or diarrhea. High fiber diets keep the colon mildly enlarged (distended) with the added fiber. This may help prevent spasms in the colon. Some forms of fiber also keep water in the stool, thereby preventing hard stools that are difficult to pass.  Besides telling you to eat more foods with fiber, your caregiver may also tell you to get more fiber by taking a fiber pill or drinking water mixed with a special high fiber powder. An example of this is a natural fiber laxative containing psyllium seed.  TIPS  Large meals can cause cramping and diarrhea in people with IBS. If this happens to you, try eating 4 or 5 small meals a day, or try eating less at each of your usual 3 meals. It may also help if your meals are low in fat and high in carbohydrates. Examples of carbohydrates are pasta, rice, whole-grain breads and cereals, fruits, and vegetables.  If dairy products cause your symptoms to flare up, you can try eating less of those foods. You might be able to handle yogurt better than other dairy products, because it contains bacteria that helps with digestion. Dairy products are an important source of calcium and other nutrients. If you need to avoid dairy products, be  sure to talk with a Registered Dietitian about getting these nutrients through other food sources.  Drink enough water and fluids to keep your urine clear or pale yellow. This is important, especially if you have diarrhea. FOR MORE INFORMATION  International Foundation for Functional Gastrointestinal Disorders: www.iffgd.org  National Digestive Diseases Information Clearinghouse: digestive.AmenCredit.is Document Released: 09/09/2003 Document Revised: 09/11/2011  Document Reviewed: 09/19/2013 [facility] Patient Information 2015 [place], [place]. This information is not intended to replace advice given to you by your health care provider. Make sure you discuss any questions you have with your health care provider.

## 2014-12-07 NOTE — Progress Notes (Signed)
Subjective:  Presents to discuss bowel habits. Having abd bloating and frequent gas especially since increasing high fiber foods. Has history of constipation. Has tried castor oil and metamucil with minimal improvement. Had hard stools last week. Now having small soft stools. Urge to defecate at times with no results. No fever. No blood in stool. No abd pain. Had colonoscopy 3-4 years ago which was normal. Milk and dairy seem to make symptoms worse. Drinks almond milk. Very stressful job. Also, c/o swelling in the left nostril/sinus area. Stays stopped up. Pressure on left side of face around sinuses.   Objective:   BP 130/76 mmHg  Temp(Src) 97.8 F (36.6 C) (Oral)  Ht 5\' 6"  (1.676 m)  Wt 182 lb 8 oz (82.781 kg)  BMI 29.47 kg/m2 NAD. Alert, oriented. TMs retracted, no erythema. Pharynx mild erythema with cloudy PND noted. Neck supple with mild anterior adenopathy. Lungs clear. Heart RRR. Abdomen soft, non distended with hypoactive BS. Non tender. No obvious masses.  Assessment:  Problem List Items Addressed This Visit      Digestive   Irritable bowel syndrome - Primary    Other Visit Diagnoses    Rhinosinusitis        Relevant Medications    amoxicillin-clavulanate (AUGMENTIN) 875-125 MG per tablet      Plan:  Meds ordered this encounter  Medications  . amoxicillin-clavulanate (AUGMENTIN) 875-125 MG per tablet    Sig: Take 1 tablet by mouth 2 (two) times daily.    Dispense:  20 tablet    Refill:  0    Order Specific Question:  Supervising Provider    Answer:  Maggie Font   Align as directed. Given info on diet for IBS. Nasacort AQ as directed.  Return if symptoms worsen or fail to improve.

## 2015-01-15 ENCOUNTER — Other Ambulatory Visit: Payer: Self-pay | Admitting: Family Medicine

## 2015-01-15 NOTE — Telephone Encounter (Signed)
Needs office visit.

## 2015-03-25 ENCOUNTER — Telehealth: Payer: Self-pay | Admitting: Family Medicine

## 2015-03-25 ENCOUNTER — Encounter: Payer: Self-pay | Admitting: Nurse Practitioner

## 2015-03-25 NOTE — Telephone Encounter (Signed)
Patient wants to know if it would be okay if she took melatonin to sleep at night?  She is on lexapro so she wasn't sure if it would be okay.

## 2015-03-25 NOTE — Telephone Encounter (Signed)
Yes. Start with Melatonin 5 mg about an hour before bedtime.

## 2015-03-26 NOTE — Telephone Encounter (Signed)
LMRC

## 2015-03-29 ENCOUNTER — Ambulatory Visit (INDEPENDENT_AMBULATORY_CARE_PROVIDER_SITE_OTHER): Payer: BC Managed Care – PPO | Admitting: Nurse Practitioner

## 2015-03-29 ENCOUNTER — Encounter: Payer: Self-pay | Admitting: Nurse Practitioner

## 2015-03-29 ENCOUNTER — Encounter: Payer: Self-pay | Admitting: Family Medicine

## 2015-03-29 VITALS — BP 110/80 | Temp 98.3°F | Ht 66.0 in | Wt 183.2 lb

## 2015-03-29 DIAGNOSIS — B9689 Other specified bacterial agents as the cause of diseases classified elsewhere: Secondary | ICD-10-CM

## 2015-03-29 DIAGNOSIS — J069 Acute upper respiratory infection, unspecified: Secondary | ICD-10-CM | POA: Diagnosis not present

## 2015-03-29 MED ORDER — AMOXICILLIN-POT CLAVULANATE 875-125 MG PO TABS: 1.0000 | ORAL_TABLET | Freq: Two times a day (BID) | ORAL | Status: DC

## 2015-03-29 NOTE — Telephone Encounter (Signed)
Discussed with patient. Patient advised she can use melatonin 5mg  one hour before bedtime. Patient verbalized understanding.

## 2015-03-30 ENCOUNTER — Encounter: Payer: Self-pay | Admitting: Nurse Practitioner

## 2015-03-30 NOTE — Progress Notes (Signed)
Subjective:  Presents for complaints of cough and congestion that began 2 days ago. No fever. Scratchy throat. Facial area headache. Postnasal drainage. Occasional cough. No wheezing. Ear pressure. Unsure about drainage color.  Objective:   BP 110/80 mmHg  Temp(Src) 98.3 F (36.8 C) (Oral)  Ht 5\' 6"  (1.676 m)  Wt 183 lb 4 oz (83.122 kg)  BMI 29.59 kg/m2 NAD. Alert, oriented. TMs retracted, no erythema. Pharynx injected with green PND noted. Neck supple with mild soft anterior adenopathy. Lungs clear. Heart regular rate rhythm.  Assessment: Bacterial upper respiratory infection  Plan:  Meds ordered this encounter  Medications  . amoxicillin-clavulanate (AUGMENTIN) 875-125 MG per tablet    Sig: Take 1 tablet by mouth 2 (two) times daily.    Dispense:  20 tablet    Refill:  0    Order Specific Question:  Supervising Provider    Answer:  Mikey Kirschner [[id number]]   OTC meds as directed for congestion and cough. Callback in 7-10 days if no improvement, sooner if worse.

## 2015-04-12 ENCOUNTER — Other Ambulatory Visit: Payer: Self-pay | Admitting: Family Medicine

## 2015-05-17 ENCOUNTER — Other Ambulatory Visit: Payer: Self-pay | Admitting: Family Medicine

## 2015-06-22 ENCOUNTER — Other Ambulatory Visit: Payer: Self-pay | Admitting: Family Medicine

## 2015-06-29 ENCOUNTER — Ambulatory Visit (INDEPENDENT_AMBULATORY_CARE_PROVIDER_SITE_OTHER): Payer: BC Managed Care – PPO | Admitting: Family Medicine

## 2015-06-29 ENCOUNTER — Encounter: Payer: Self-pay | Admitting: Family Medicine

## 2015-06-29 VITALS — Ht 66.0 in

## 2015-06-29 DIAGNOSIS — R110 Nausea: Secondary | ICD-10-CM

## 2015-06-29 DIAGNOSIS — H8110 Benign paroxysmal vertigo, unspecified ear: Secondary | ICD-10-CM

## 2015-06-29 MED ORDER — PROMETHAZINE HCL 25 MG/ML IJ SOLN
25.0000 mg | Freq: Once | INTRAMUSCULAR | Status: AC
  Administered 2015-06-29: 25 mg via INTRAVENOUS

## 2015-06-29 MED ORDER — PROMETHAZINE HCL 25 MG PO TABS: 25.0000 mg | ORAL_TABLET | Freq: Three times a day (TID) | ORAL | Status: DC | PRN

## 2015-06-29 MED ORDER — MECLIZINE HCL 25 MG PO TABS: 25.0000 mg | ORAL_TABLET | Freq: Three times a day (TID) | ORAL | Status: DC | PRN

## 2015-06-29 NOTE — Progress Notes (Signed)
   Subjective:    Patient ID: [name], female    DOB: 1960/10/08, 54 y.o.   MRN: [id number]  HPI Patient arrives with complaint of dizziness- feels like the room is spinning and moving up and down-causing nausea-started suddenly yest am when she went to get up out of the bed.  patient states that is worse yesterday still present today. Denies fever chills with it. Denies any headache she relates slight neck soreness from laying on her side all day yesterday and today. She denies any chest congestion she does state some sinus pressure recently but no severe infection. Denies any unilateral numbness or weakness.  Review of Systems  she feels like the room is spinning she feels nauseous she denies headache. Denies unilateral numbness weakness.    Objective:   Physical Exam  horizontal nystagmus is noted. Patient able to follow commands. Throat normal mucous membranes moist neck supple lungs are clear hearts regular       Assessment & Plan:   intense vertigo- Phenergan for nausea, Antivert 4 times a day,Epley  Maneuver was shown but patient cannot complete this currently because of severe nausea therefore she can do this on her own at home. Antivert when necessary as well , if not significantly better over the next 48 hours consider referral to ENT as well. I do not feel the patient is having a stroke.

## 2015-06-29 NOTE — Patient Instructions (Signed)

## 2015-07-06 ENCOUNTER — Encounter: Payer: Self-pay | Admitting: Family Medicine

## 2015-07-06 ENCOUNTER — Telehealth: Payer: Self-pay | Admitting: Family Medicine

## 2015-07-06 MED ORDER — AMOXICILLIN 500 MG PO CAPS: 500.0000 mg | ORAL_CAPSULE | Freq: Three times a day (TID) | ORAL | Status: DC

## 2015-07-06 NOTE — Telephone Encounter (Signed)
Rx sent electronically to pharmacy. Patient notified. 

## 2015-07-06 NOTE — Telephone Encounter (Signed)
Pt was seen last week for vertigo and is now stating that she has sinus drainage. Pt wants to know if there is anything she should be taking for the sinus drainage. Pt states the vertigo is better except when she lays down and turns her head to the left. She is up walking around today with no dizziness.

## 2015-07-06 NOTE — Telephone Encounter (Signed)
Given the ongoing sinus symptoms she may go ahead with amoxicillin 500 mg 1 3 times a day 10 days-follow-up if ongoing troubles

## 2015-08-16 ENCOUNTER — Telehealth: Payer: Self-pay | Admitting: Family Medicine

## 2015-08-16 MED ORDER — CEFPROZIL 500 MG PO TABS: ORAL_TABLET | ORAL | Status: DC

## 2015-08-16 NOTE — Telephone Encounter (Signed)
Certainly possible that this sinus infection could be cycling back around. Given that the nasal drainage is clear she could give this a few days to see if it will improve on its own but if she would like for [place] to go ahead with an antibiotic I am willing to do so. Please be aware that future infections would require office visit. Certainly if any problems we'll be happy to see her as well. Please see with patient would like to do them we can go from that

## 2015-08-16 NOTE — Telephone Encounter (Signed)
Seen 12/27 for vertigo, called in antibiotic on 1/3 for sinus infection  Pt states she is having the same symptoms again at this time Pressure around eyes, drainage, congestion, clear runny nose, cough at night   [employer]

## 2015-08-16 NOTE — Telephone Encounter (Signed)
cefzil 500 one bid 10 days

## 2015-08-16 NOTE — Telephone Encounter (Signed)
Patient states that she has been dealing with these symptoms since Thursday and she would like an antibiotic sent in to [facility]. (no need to call patient back, she will check at pharmacy at the end of the day)

## 2015-08-16 NOTE — Telephone Encounter (Signed)
Called patient and informed her per Dr.Scott Luking -Cefzil 500 mg 1 tablet twice a day for 10 days was being sent over for 10 days.

## 2015-08-16 NOTE — Telephone Encounter (Signed)
LMRC

## 2015-09-17 ENCOUNTER — Ambulatory Visit (INDEPENDENT_AMBULATORY_CARE_PROVIDER_SITE_OTHER): Payer: BLUE CROSS/BLUE SHIELD | Admitting: Family Medicine

## 2015-09-17 ENCOUNTER — Telehealth: Payer: Self-pay | Admitting: Family Medicine

## 2015-09-17 ENCOUNTER — Encounter: Payer: Self-pay | Admitting: Family Medicine

## 2015-09-17 ENCOUNTER — Ambulatory Visit (HOSPITAL_COMMUNITY)
Admission: RE | Admit: 2015-09-17 | Discharge: 2015-09-17 | Disposition: A | Discharge status: Home / Self Care | Payer: BC Managed Care – PPO | Source: Ambulatory Visit | Attending: Family Medicine | Admitting: Family Medicine

## 2015-09-17 VITALS — BP 122/70 | Ht 66.0 in | Wt 184.0 lb

## 2015-09-17 DIAGNOSIS — S99922A Unspecified injury of left foot, initial encounter: Secondary | ICD-10-CM | POA: Diagnosis not present

## 2015-09-17 DIAGNOSIS — M79675 Pain in left toe(s): Secondary | ICD-10-CM | POA: Diagnosis not present

## 2015-09-17 DIAGNOSIS — S92425A Nondisplaced fracture of distal phalanx of left great toe, initial encounter for closed fracture: Secondary | ICD-10-CM | POA: Insufficient documentation

## 2015-09-17 DIAGNOSIS — Z23 Encounter for immunization: Secondary | ICD-10-CM | POA: Diagnosis not present

## 2015-09-17 DIAGNOSIS — X58XXXA Exposure to other specified factors, initial encounter: Secondary | ICD-10-CM | POA: Insufficient documentation

## 2015-09-17 MED ORDER — HYDROCODONE-ACETAMINOPHEN 5-325 MG PO TABS: 1.0000 | ORAL_TABLET | Freq: Four times a day (QID) | ORAL | Status: DC | PRN

## 2015-09-17 MED ORDER — AMOXICILLIN-POT CLAVULANATE 875-125 MG PO TABS: 1.0000 | ORAL_TABLET | Freq: Two times a day (BID) | ORAL | Status: DC

## 2015-09-17 NOTE — Telephone Encounter (Signed)
Patient saw Dr. Richardson Landry this afternoon regarding her toe.  She said at the time she chose not to have any pain medication called in, but she would like to go ahead and have something called in just in case she needs it over the weekend.   [employer]

## 2015-09-17 NOTE — Progress Notes (Signed)
   Subjective:    Patient ID: [name], female    DOB: 07/24/1960, 55 y.o.   MRN: [id number]  Toe Pain  Incident onset: yesterday. The incident occurred at home (dropped a piece of wood on foot). Pain location: left great toe. She has tried elevation, ice and NSAIDs for the symptoms.      Review of Systems The knee pain no ankle pain no loss of consciousness    Objective:   Physical Exam  Alert vital stable lungs clear great toe inflamed swollen tender distal abrasion with slight seeping      Assessment & Plan:  Impression probable toe fracture addendum x-ray revealed distal nondisplaced fracture left distal phalanx plan pain medicine. Surgical shoe next couple weeks. Gradual return to activity after that. Hydrocodone when necessary for pain tenderness shot antibiotics due to superficial skin with slight

## 2015-09-21 NOTE — Telephone Encounter (Signed)
See result note.  

## 2015-09-23 ENCOUNTER — Other Ambulatory Visit: Payer: Self-pay | Admitting: Family Medicine

## 2015-10-26 ENCOUNTER — Other Ambulatory Visit: Payer: Self-pay | Admitting: Family Medicine

## 2015-10-26 NOTE — Telephone Encounter (Signed)
May refill this +2 additional refills but patient does need to have medicine visit within the next 3 months

## 2016-02-11 ENCOUNTER — Other Ambulatory Visit: Payer: Self-pay | Admitting: Family Medicine

## 2016-02-11 NOTE — Telephone Encounter (Signed)
This +1 refill-patient needs office visit for this medicine

## 2016-03-27 ENCOUNTER — Ambulatory Visit: Payer: BLUE CROSS/BLUE SHIELD | Admitting: Family Medicine

## 2016-03-30 ENCOUNTER — Encounter: Payer: Self-pay | Admitting: Nurse Practitioner

## 2016-03-30 ENCOUNTER — Ambulatory Visit (INDEPENDENT_AMBULATORY_CARE_PROVIDER_SITE_OTHER): Payer: BLUE CROSS/BLUE SHIELD | Admitting: Nurse Practitioner

## 2016-03-30 VITALS — BP 120/80 | Ht 66.0 in | Wt 179.5 lb

## 2016-03-30 DIAGNOSIS — D239 Other benign neoplasm of skin, unspecified: Secondary | ICD-10-CM | POA: Diagnosis not present

## 2016-03-30 DIAGNOSIS — R5383 Other fatigue: Secondary | ICD-10-CM | POA: Diagnosis not present

## 2016-03-30 DIAGNOSIS — M722 Plantar fascial fibromatosis: Secondary | ICD-10-CM

## 2016-03-30 DIAGNOSIS — R7303 Prediabetes: Secondary | ICD-10-CM | POA: Diagnosis not present

## 2016-03-30 DIAGNOSIS — F419 Anxiety disorder, unspecified: Secondary | ICD-10-CM

## 2016-03-30 DIAGNOSIS — D229 Melanocytic nevi, unspecified: Secondary | ICD-10-CM

## 2016-03-30 DIAGNOSIS — Z79899 Other long term (current) drug therapy: Secondary | ICD-10-CM

## 2016-03-30 DIAGNOSIS — E785 Hyperlipidemia, unspecified: Secondary | ICD-10-CM

## 2016-03-30 DIAGNOSIS — Z23 Encounter for immunization: Secondary | ICD-10-CM | POA: Diagnosis not present

## 2016-03-30 MED ORDER — DICLOFENAC SODIUM 75 MG PO TBEC: 75.0000 mg | DELAYED_RELEASE_TABLET | Freq: Two times a day (BID) | ORAL | 2 refills | Status: DC

## 2016-03-30 NOTE — Progress Notes (Signed)
Subjective:  Presents for recheck of her anxiety. Lexapro working well, slightly flat affect but patient is getting ready to face some very stressful events in her life and would like to continue for now. Our plan is for her to cut the dose in half once her stress level has improved. Has Xanax at home, recommend restarting this at bedtime for sleep. Broke her toe back in March. Lately has developed some pain in the arch of the foot near the heel on both feet especially first thing in the morning or when she's been sitting for a long time and stands up. Toe has healed without difficulty. Also has some moles that she is concerned about slight change over time. Mild fatigue. Would like her regular lab work ordered.  Objective:   BP 120/80   Ht 5\' 6"  (1.676 m)   Wt 179 lb 8 oz (81.4 kg)   BMI 28.97 kg/m  NAD. Alert, oriented. Lungs clear. Heart regular rate rhythm. Minimal tenderness noted with palpation of the arch of the foot. Thoughts logical coherent and relevant. Dressed appropriately. Has a well-defined slightly discolored round lesion on the left upper thigh slightly dry in appearance. Has another lesion on the left mid back area consistent with seborrheic keratoses. Another lesion in the mid back area has irregular borders with no irregular color change. Significant sun damage noted in general.   Assessment:  Problem List Items Addressed This Visit      Other   Hyperlipemia   Relevant Orders   Lipid panel   Prediabetes   Relevant Orders   Hemoglobin A1c    Other Visit Diagnoses    Anxiety    -  Primary   Plantar fasciitis       Atypical nevi       Need for vaccination       Relevant Orders   Flu Vaccine QUAD 36+ mos PF IM (Fluarix & Fluzone Quad PF) (Completed)   Other fatigue       Relevant Orders   CBC with Differential/Platelet   VITAMIN D 25 Hydroxy (Vit-D Deficiency, Fractures)   TSH   High risk medication use       Relevant Orders   Basic metabolic panel   Hepatic  function panel     Plan:  Meds ordered this encounter  Medications  . diclofenac (VOLTAREN) 75 MG EC tablet    Sig: Take 1 tablet (75 mg total) by mouth 2 (two) times daily.    Dispense:  30 tablet    Refill:  2    Order Specific Question:   Supervising Provider    Answer:   Maggie Font   Given written and verbal information on plantar fascitis. Call back if worsens or persists. Continue current dose of Lexapro for now. Refer to dermatology for skin cancer screening and evaluation of lesions. Restart Xanax she has a home for bedtime for sleep. Strongly recommend preventive health physical and mammogram. Return in about 6 months (around 09/27/2016).

## 2016-03-30 NOTE — Patient Instructions (Signed)
Plantar Fasciitis Plantar fasciitis is a painful foot condition that affects the heel. It occurs when the band of tissue that connects the toes to the heel bone (plantar fascia) becomes irritated. This can happen after exercising too much or doing other repetitive activities (overuse injury). The pain from plantar fasciitis can range from mild irritation to severe pain that makes it difficult for you to walk or move. The pain is usually worse in the morning or after you have been sitting or lying down for a while. CAUSES This condition may be caused by:  Standing for long periods of time.  Wearing shoes that do not fit.  Doing high-impact activities, including running, aerobics, and ballet.  Being overweight.  Having an abnormal way of walking (gait).  Having tight calf muscles.  Having high arches in your feet.  Starting a new athletic activity. SYMPTOMS The main symptom of this condition is heel pain. Other symptoms include:  Pain that gets worse after activity or exercise.  Pain that is worse in the morning or after resting.  Pain that goes away after you walk for a few minutes. DIAGNOSIS This condition may be diagnosed based on your signs and symptoms. Your health care provider will also do a physical exam to check for:  A tender area on the bottom of your foot.  A high arch in your foot.  Pain when you move your foot.  Difficulty moving your foot. You may also need to have imaging studies to confirm the diagnosis. These can include:  X-rays.  Ultrasound.  MRI. TREATMENT  Treatment for plantar fasciitis depends on the severity of the condition. Your treatment may include:  Rest, ice, and over-the-counter pain medicines to manage your pain.  Exercises to stretch your calves and your plantar fascia.  A splint that holds your foot in a stretched, upward position while you sleep (night splint).  Physical therapy to relieve symptoms and prevent problems in the  future.  Cortisone injections to relieve severe pain.  Extracorporeal shock wave therapy (ESWT) to stimulate damaged plantar fascia with electrical impulses. It is often used as a last resort before surgery.  Surgery, if other treatments have not worked after 12 months. HOME CARE INSTRUCTIONS  Take medicines only as directed by your health care provider.  Avoid activities that cause pain.  Roll the bottom of your foot over a bag of ice or a bottle of cold water. Do this for 20 minutes, 3-4 times a day.  Perform simple stretches as directed by your health care provider.  Try wearing athletic shoes with air-sole or gel-sole cushions or soft shoe inserts.  Wear a night splint while sleeping, if directed by your health care provider.  Keep all follow-up appointments with your health care provider. PREVENTION   Do not perform exercises or activities that cause heel pain.  Consider finding low-impact activities if you continue to have problems.  Lose weight if you need to. The best way to prevent plantar fasciitis is to avoid the activities that aggravate your plantar fascia. SEEK MEDICAL CARE IF:  Your symptoms do not go away after treatment with home care measures.  Your pain gets worse.  Your pain affects your ability to move or do your daily activities.   This information is not intended to replace advice given to you by your health care provider. Make sure you discuss any questions you have with your health care provider.   Document Released: 03/14/2001 Document Revised: 03/10/2015 Document Reviewed: 04/29/2014 Elsevier   Interactive Patient Education 2016 [employer]vier Inc.  

## 2016-04-17 ENCOUNTER — Telehealth: Payer: Self-pay | Admitting: Family Medicine

## 2016-04-17 ENCOUNTER — Encounter: Payer: Self-pay | Admitting: Family Medicine

## 2016-04-17 DIAGNOSIS — D039 Melanoma in situ, unspecified: Secondary | ICD-10-CM | POA: Insufficient documentation

## 2016-04-17 IMAGING — DX DG TOE GREAT 2+V*L*
4 series · 4 of 4 positions shown · non-contrast
Comparison: None

CLINICAL DATA: LEFT great toe injury, droppedlarge log on great toe
yesterday afternoon getting Nola Oxendine, initial encounter

EXAM:
LEFT GREAT TOE

[toe ap]
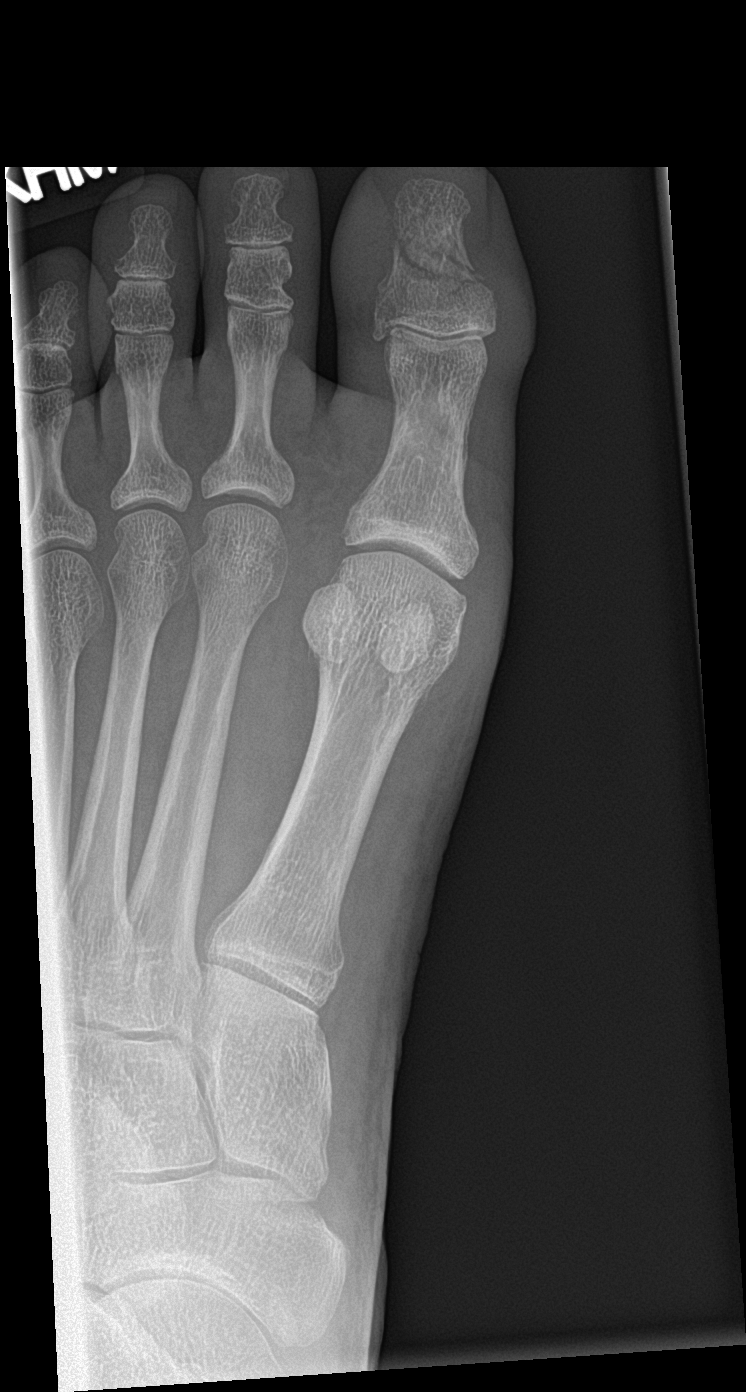

[toe obl]
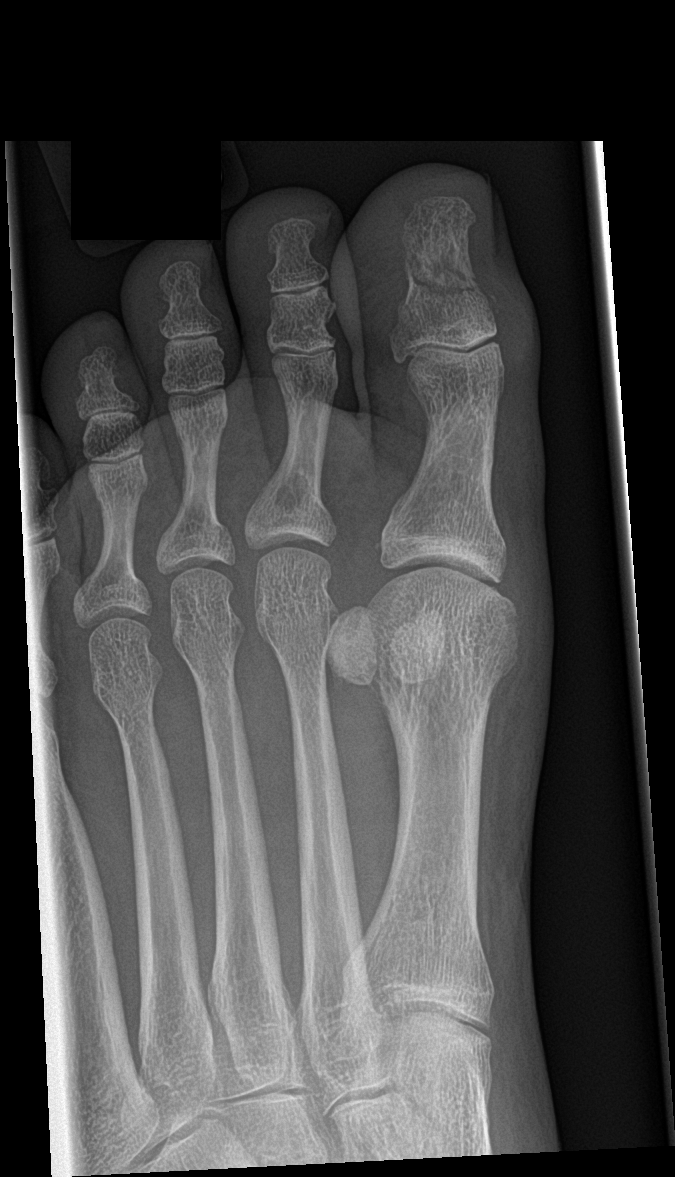

[toe lat (1 of 2)]
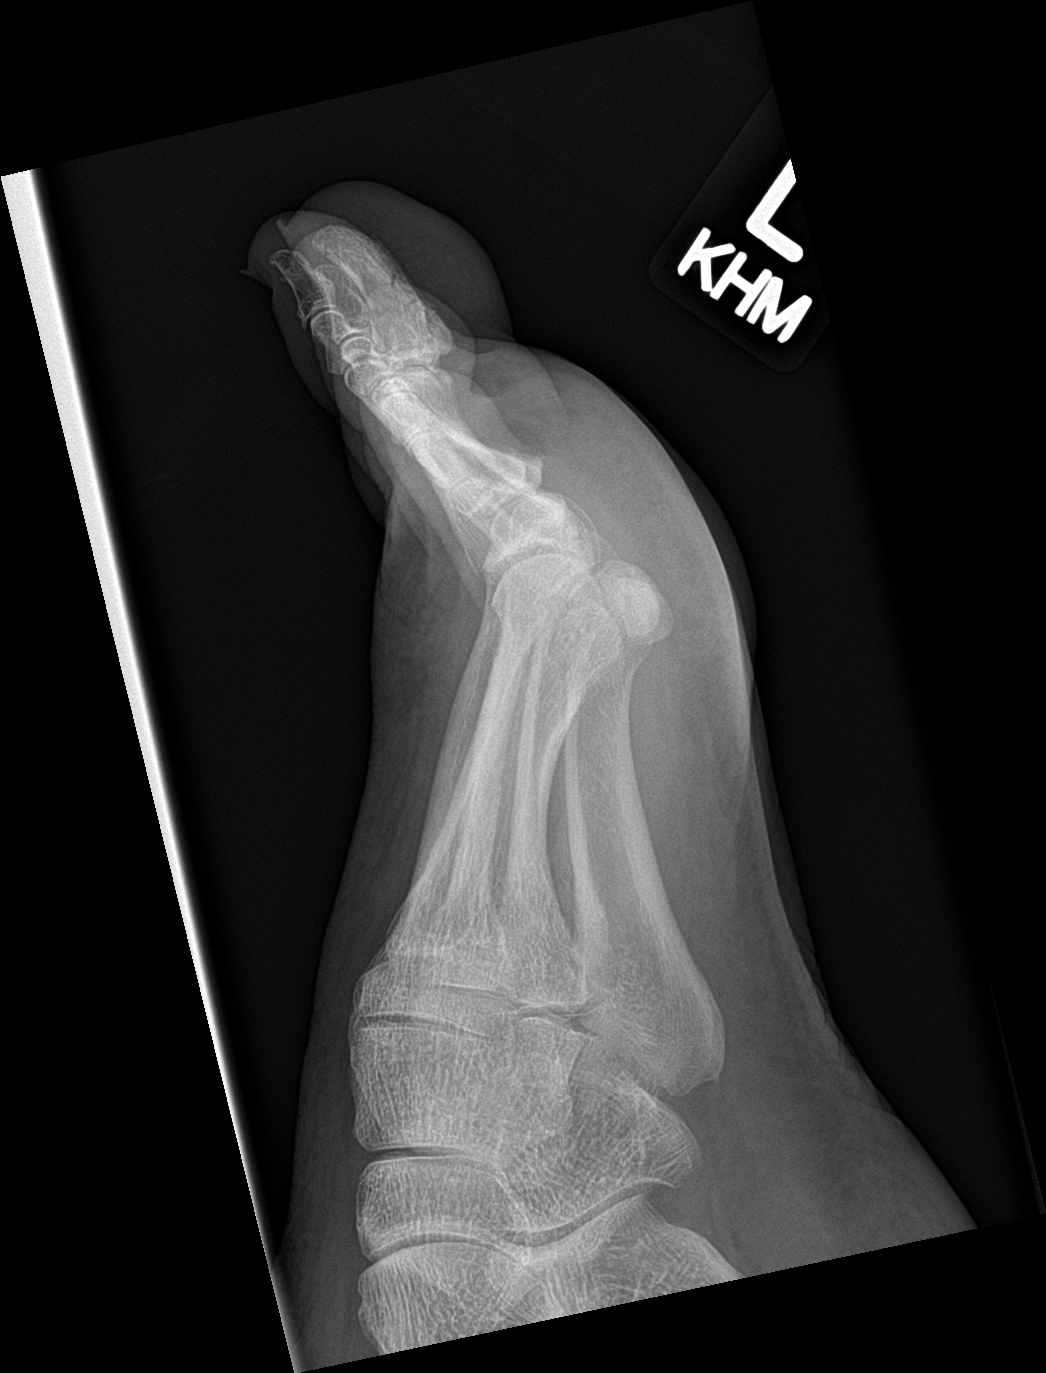

[toe lat (2 of 2)]
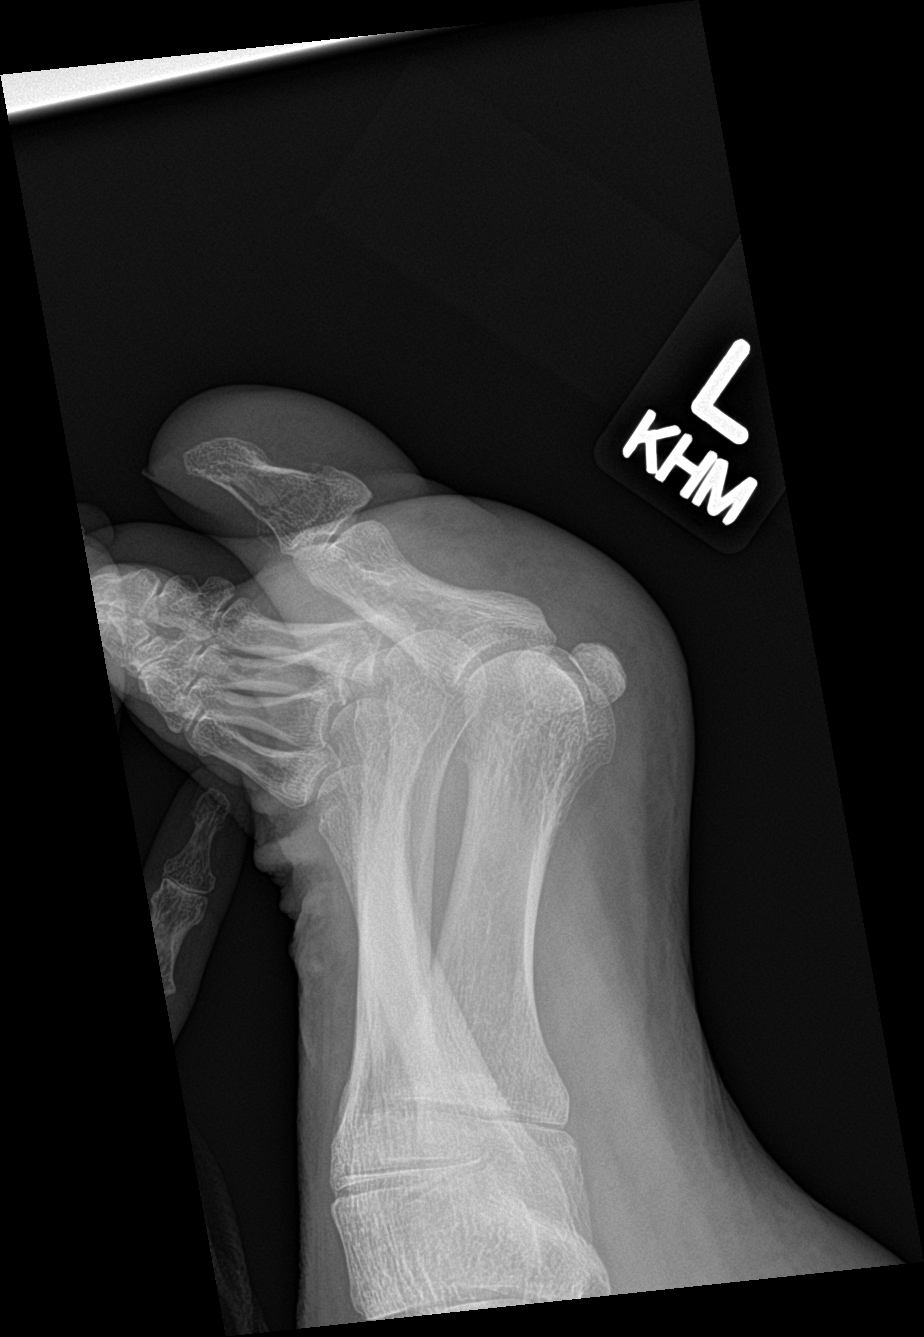

[4 of 4 positions shown; findings below may reference images not displayed]

FINDINGS: Low normal osseous mineralization.

Joint spaces preserved.

Essentially nondisplaced fracture at mid portion of distal phalanx
LEFT great toe.

No extension to the IP joint.

Overlying soft tissue swelling.

No additional fracture, dislocation or bone destruction.
IMPRESSION: Nondisplaced fracture through mid portion of distal phalanx LEFT
great toe.

## 2016-04-17 NOTE — Telephone Encounter (Signed)
Skin shaving showed a melanoma in situ patient is aware being managed by Dr. Juel Burrow office they are doing a reexcision

## 2016-04-17 NOTE — Telephone Encounter (Signed)
Review dermapathology report in results folder. °

## 2016-04-20 LAB — VITAMIN D 25 HYDROXY (VIT D DEFICIENCY, FRACTURES): VIT D 25 HYDROXY: 33.4 ng/mL (ref 30.0–100.0)

## 2016-04-20 LAB — CBC WITH DIFFERENTIAL/PLATELET
BASOS: 1 %
Basophils Absolute: 0 10*3/uL (ref 0.0–0.2)
EOS (ABSOLUTE): 0.2 10*3/uL (ref 0.0–0.4)
EOS: 3 %
HEMATOCRIT: 39.6 % (ref 34.0–46.6)
HEMOGLOBIN: 13.5 g/dL (ref 11.1–15.9)
Immature Grans (Abs): 0 10*3/uL (ref 0.0–0.1)
Immature Granulocytes: 0 %
LYMPHS ABS: 2.1 10*3/uL (ref 0.7–3.1)
Lymphs: 38 %
MCH: 31.9 pg (ref 26.6–33.0)
MCHC: 34.1 g/dL (ref 31.5–35.7)
MCV: 94 fL (ref 79–97)
MONOCYTES: 10 %
Monocytes Absolute: 0.6 10*3/uL (ref 0.1–0.9)
NEUTROS ABS: 2.6 10*3/uL (ref 1.4–7.0)
Neutrophils: 48 %
Platelets: 265 10*3/uL (ref 150–379)
RBC: 4.23 x10E6/uL (ref 3.77–5.28)
RDW: 13.1 % (ref 12.3–15.4)
WBC: 5.4 10*3/uL (ref 3.4–10.8)

## 2016-04-20 LAB — BASIC METABOLIC PANEL
BUN / CREAT RATIO: 21 (ref 9–23)
BUN: 22 mg/dL (ref 6–24)
CO2: 22 mmol/L (ref 18–29)
CREATININE: 1.03 mg/dL — AB (ref 0.57–1.00)
Calcium: 9.9 mg/dL (ref 8.7–10.2)
Chloride: 100 mmol/L (ref 96–106)
GFR calc Af Amer: 71 mL/min/{1.73_m2} (ref >59)
GFR, EST NON AFRICAN AMERICAN: 62 mL/min/{1.73_m2} (ref >59)
Glucose: 92 mg/dL (ref 65–99)
Potassium: 5.1 mmol/L (ref 3.5–5.2)
SODIUM: 141 mmol/L (ref 134–144)

## 2016-04-20 LAB — HEPATIC FUNCTION PANEL
ALBUMIN: 4.6 g/dL (ref 3.5–5.5)
ALT: 39 IU/L — ABNORMAL HIGH (ref 0–32)
AST: 24 IU/L (ref 0–40)
Alkaline Phosphatase: 111 IU/L (ref 39–117)
Bilirubin Total: 0.3 mg/dL (ref 0.0–1.2)
Bilirubin, Direct: 0.08 mg/dL (ref 0.00–0.40)
Total Protein: 7.9 g/dL (ref 6.0–8.5)

## 2016-04-20 LAB — LIPID PANEL
Chol/HDL Ratio: 5 ratio units — ABNORMAL HIGH (ref 0.0–4.4)
Cholesterol, Total: 286 mg/dL — ABNORMAL HIGH (ref 100–199)
HDL: 57 mg/dL (ref >39)
LDL CALC: 198 mg/dL — AB (ref 0–99)
TRIGLYCERIDES: 156 mg/dL — AB (ref 0–149)
VLDL Cholesterol Cal: 31 mg/dL (ref 5–40)

## 2016-04-20 LAB — HEMOGLOBIN A1C
ESTIMATED AVERAGE GLUCOSE: 105 mg/dL
HEMOGLOBIN A1C: 5.3 % (ref 4.8–5.6)

## 2016-04-20 LAB — TSH: TSH: 2.03 u[IU]/mL (ref 0.450–4.500)

## 2016-04-26 ENCOUNTER — Other Ambulatory Visit: Payer: Self-pay | Admitting: Family Medicine

## 2016-05-11 ENCOUNTER — Telehealth: Payer: Self-pay

## 2016-05-11 ENCOUNTER — Other Ambulatory Visit: Payer: Self-pay | Admitting: Nurse Practitioner

## 2016-05-11 ENCOUNTER — Encounter: Payer: Self-pay | Admitting: Nurse Practitioner

## 2016-05-11 DIAGNOSIS — E7849 Other hyperlipidemia: Secondary | ICD-10-CM

## 2016-05-11 DIAGNOSIS — Z79899 Other long term (current) drug therapy: Secondary | ICD-10-CM

## 2016-05-11 MED ORDER — PRAVASTATIN SODIUM 20 MG PO TABS: 20.0000 mg | ORAL_TABLET | Freq: Every day | ORAL | 2 refills | Status: DC

## 2016-05-11 NOTE — Telephone Encounter (Signed)
Patient received MyChart message. Please send medication to [facility].

## 2016-07-11 ENCOUNTER — Encounter: Payer: Self-pay | Admitting: Family Medicine

## 2016-07-11 ENCOUNTER — Ambulatory Visit (INDEPENDENT_AMBULATORY_CARE_PROVIDER_SITE_OTHER): Payer: BLUE CROSS/BLUE SHIELD | Admitting: Family Medicine

## 2016-07-11 VITALS — BP 110/72 | Temp 98.2°F | Ht 66.0 in | Wt 179.2 lb

## 2016-07-11 DIAGNOSIS — R1013 Epigastric pain: Secondary | ICD-10-CM | POA: Diagnosis not present

## 2016-07-11 MED ORDER — PANTOPRAZOLE SODIUM 40 MG PO TBEC: 40.0000 mg | DELAYED_RELEASE_TABLET | Freq: Every day | ORAL | 3 refills | Status: DC

## 2016-07-11 NOTE — Progress Notes (Signed)
   Subjective:    Patient ID: [name], female    DOB: Sep 23, 1960, 56 y.o.   MRN: [id number]  HPI Patient is here today for nausea. Onset 1- 1 1/2 months ago. Worse when eating. Treatments tried: Tums, Pepto Bismol, ginger root with mild relief.   Patient with persistent nausea is been going on for several weeks denies epigastric pain side pain flank pain denies fever sweats chills does relate frequent heartburn no dysphagia no wheezing or difficulty breathing   Patient sees Dr. Toy Cookey a veterinarian in [place], [place] and he tested her for parasites and results came back positive. Please see paper on front of folder. 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 and she was encouraged by her back Jinny Sanders to get stool testing which showed possible over them parasites patient states she's never visibly seen these issues Review of Systems She denies rectal bleeding vomiting diarrhea sweats chills   denies cough wheezing Objective:   Physical Exam Lungs clear heart regular abdomen is soft no guarding rebound extremities no edema       Assessment & Plan:  Abnormal stool testing per Jinny Sanders will review this then see what we will recommend  Nausea-it is possible this could be dyspepsia related to her heartburn recommend PPI over the course of next couple weeks if this does not figure out what the issue is then will have the patient hold 1 medicine at a time over the next several weeks if ongoing nausea despite all that referral to gastroenterology patient will send this message in a couple weeks how she is doing  Obviously follow-up if progressive troubles, we will do some lab work but no x-rays indicated

## 2016-07-31 ENCOUNTER — Encounter: Payer: Self-pay | Admitting: Family Medicine

## 2016-08-02 ENCOUNTER — Encounter: Payer: Self-pay | Admitting: Family Medicine

## 2016-08-02 ENCOUNTER — Ambulatory Visit (INDEPENDENT_AMBULATORY_CARE_PROVIDER_SITE_OTHER): Payer: BLUE CROSS/BLUE SHIELD | Admitting: Family Medicine

## 2016-08-02 VITALS — BP 110/80 | Temp 98.5°F | Ht 66.0 in | Wt 182.1 lb

## 2016-08-02 DIAGNOSIS — J329 Chronic sinusitis, unspecified: Secondary | ICD-10-CM | POA: Diagnosis not present

## 2016-08-02 DIAGNOSIS — H8110 Benign paroxysmal vertigo, unspecified ear: Secondary | ICD-10-CM

## 2016-08-02 MED ORDER — AMOXICILLIN 500 MG PO CAPS: 500.0000 mg | ORAL_CAPSULE | Freq: Three times a day (TID) | ORAL | 0 refills | Status: DC

## 2016-08-02 MED ORDER — MECLIZINE HCL 25 MG PO TABS: 25.0000 mg | ORAL_TABLET | Freq: Three times a day (TID) | ORAL | 0 refills | Status: DC | PRN

## 2016-08-02 NOTE — Progress Notes (Signed)
   Subjective:    Patient ID: [name], female    DOB: 06-17-1961, 56 y.o.   MRN: [id number]  Dizziness  This is a new problem. The current episode started 1 to 4 weeks ago. The problem occurs intermittently. The problem has been unchanged. Associated symptoms include congestion, fatigue and a visual change. Associated symptoms comments: drainage. Nothing aggravates the symptoms. She has tried nothing for the symptoms. The treatment provided no relief.   Patient has no other concerns at this time.   Last wk, tue , went in the wrong direction and went in the wrong direct  Unsteady and dizzy  Took the meclizine  Sinus drainage and cong nd stuffy  Felt very unsteady when showering, came and go, off and on,  Got very sleepy  Head feels heavy   No nausea or queazziness  Eyes got a bit of a sunburst cloudiness, last ed for about five minutes  From, drainage and such nof fever  n Pos hx of migranien   Tylenol  As need ed    Review of Systems  Constitutional: Positive for fatigue.  HENT: Positive for congestion.   Neurological: Positive for dizziness.       Objective:   Physical Exam  Alert vitals stable mild nasal congestion. TMs normal pharynx normal neck supple. Lungs clear. Heart regular in rhythm neuro exam intact cerebellar function normal funduscopic exam normal      Assessment & Plan:  Impression flare of benign vertigo with possible element of rhinosinusitis plan continue when necessary Antivert. Antibiotics prescribed symptom care discussed WSL

## 2016-11-09 ENCOUNTER — Other Ambulatory Visit: Payer: Self-pay | Admitting: Family Medicine

## 2017-01-16 ENCOUNTER — Encounter: Payer: Self-pay | Admitting: Nurse Practitioner

## 2017-01-16 ENCOUNTER — Encounter: Payer: Self-pay | Admitting: Family Medicine

## 2017-01-16 ENCOUNTER — Other Ambulatory Visit: Payer: Self-pay | Admitting: Family Medicine

## 2017-01-16 DIAGNOSIS — B829 Intestinal parasitism, unspecified: Secondary | ICD-10-CM

## 2017-01-16 NOTE — Addendum Note (Signed)
Addended by: Ofilia Neas R on: 01/16/2017 04:40 PM   Modules accepted: Orders

## 2017-01-16 NOTE — Telephone Encounter (Signed)
This +2 refills needs follow-up office visit by September

## 2017-01-16 NOTE — Telephone Encounter (Signed)
Eases assist the patient with ova and parasite ordering times one due to patient having intestinal problems with exposure to parasites

## 2017-02-02 ENCOUNTER — Encounter: Payer: Self-pay | Admitting: Nurse Practitioner

## 2017-02-28 ENCOUNTER — Encounter: Payer: Self-pay | Admitting: Nurse Practitioner

## 2017-02-28 ENCOUNTER — Ambulatory Visit (INDEPENDENT_AMBULATORY_CARE_PROVIDER_SITE_OTHER): Payer: BLUE CROSS/BLUE SHIELD | Admitting: Nurse Practitioner

## 2017-02-28 VITALS — BP 120/72 | Ht 66.0 in | Wt 186.0 lb

## 2017-02-28 DIAGNOSIS — L255 Unspecified contact dermatitis due to plants, except food: Secondary | ICD-10-CM | POA: Diagnosis not present

## 2017-02-28 MED ORDER — PREDNISONE 20 MG PO TABS: ORAL_TABLET | ORAL | 0 refills | Status: DC

## 2017-02-28 MED ORDER — TRIAMCINOLONE ACETONIDE 0.1 % EX CREA: 1.0000 "application " | TOPICAL_CREAM | Freq: Two times a day (BID) | CUTANEOUS | 0 refills | Status: DC

## 2017-02-28 NOTE — Progress Notes (Signed)
   Subjective:    Patient ID: [name], female    DOB: [birth year], 56 y.o.   MRN: [id number]  HPI: 56 y/o female patient presents today with bilateral rash to medial thigh and lower leg that began 02/24/17. Pt states the rash appears to be worsening and spreading with new redness appearing on her face and R eyelid 02/28/17. Pt reports severe pruritis with little relief from Bendaryl cream. Pt states she was working in her yard Friday pulling weeds.    Review of Systems  Eyes: Negative for pain and itching.  Respiratory: Negative for cough, chest tightness and shortness of breath.   Skin: Positive for rash.  Allergic/Immunologic: Negative for environmental allergies.       Objective:   Physical Exam  Constitutional: She is oriented to person, place, and time. Vital signs are normal. She appears well-developed and well-nourished.  HENT:  Head:    Eyes: Pupils are equal, round, and reactive to light. Conjunctivae and EOM are normal. Right eye exhibits no discharge. Left eye exhibits no discharge.    Cardiovascular: Normal rate, regular rhythm and normal heart sounds.   Pulmonary/Chest: Effort normal and breath sounds normal.  Neurological: She is alert and oriented to person, place, and time.  Skin: Skin is warm and dry. Rash noted. Rash is papular and vesicular.     Psychiatric: She has a normal mood and affect. Her behavior is normal.      Assessment & Plan:  1. Contact dermatitis due to plant Patient provided with medication information, how to care for contact dermatitis from [facility], and ways to avoid future reactions on AVS paperwork.   Meds ordered this encounter  Medications  . DISCONTD: predniSONE (DELTASONE) 20 MG tablet    Sig: 3 po qd x 3 d then 2 po qd x 3 d then 1 po qd x 2 d    Dispense:  17 tablet    Refill:  0    Order Specific Question:   Supervising Provider    Answer:   Mikey Kirschner [[id number]]  . predniSONE (DELTASONE) 20 MG tablet    Sig: 3  po qd x 3 d then 2 po qd x 3 d then 1 po qd x 2 d    Dispense:  17 tablet    Refill:  0    Order Specific Question:   Supervising Provider    Answer:   Mikey Kirschner [[id number]]  . triamcinolone cream (KENALOG) 0.1 %    Sig: Apply 1 application topically 2 (two) times daily. Prn rash; use up to 2 weeks    Dispense:  30 g    Refill:  0    Order Specific Question:   Supervising Provider    Answer:   Mikey Kirschner [[id number]]   Return if symptoms worsen or fail to improve.

## 2017-02-28 NOTE — Patient Instructions (Addendum)
Keep areas cool and dry- wearing cotton/loose fitting clothing can help with itching and discomfort.  Bendaryl 25mg  (over the counter) by mouth at bedtime for itching relief. **This may cause drowsiness   Allegra or Claritin (over the counter) by mouth in the morning for itching relief.  Cool compresses may be used to help with itching.  Apply Calamine lotion (over the counter) to the areas as instructed on the bottle to aid with drying of the lesions and itching as needed.   Apply over the topical steroid cream to the areas as prescribed for itching as needed. Do not cover the area with bandages after applying.   Poison Ivy Dermatitis Poison ivy dermatitis is inflammation of the skin that is caused by the allergens on the leaves of the poison ivy plant. The skin reaction often involves redness, swelling, blisters, and extreme itching. What are the causes? This condition is caused by a specific chemical (urushiol) found in the sap of the poison ivy plant. This chemical is sticky and can be easily spread to people, animals, and objects. You can get poison ivy dermatitis by:  Having direct contact with a poison ivy plant.  Touching animals, other people, or objects that have come in contact with poison ivy and have the chemical on them.  What increases the risk? This condition is more likely to develop in:  People who are outdoors often.  People who go outdoors without wearing protective clothing, such as closed shoes, long pants, and a long-sleeved shirt.  What are the signs or symptoms? Symptoms of this condition include:  Redness and itching.  A rash that often includes bumps and blisters. The rash usually appears 48 hours after exposure.  Swelling. This may occur if the reaction is more severe.  Symptoms usually last for 1-2 weeks. However, the first time you develop this condition, symptoms may last 3-4 weeks. How is this diagnosed? This condition may be diagnosed based on  your symptoms and a physical exam. Your health care provider may also ask you about any recent outdoor activity. How is this treated? Treatment for this condition will vary depending on how severe it is. Treatment may include:  Hydrocortisone creams or calamine lotions to relieve itching.  Oatmeal baths to soothe the skin.  Over-the-counter antihistamine tablets.  Oral steroid medicine for more severe outbreaks.  Follow these instructions at home:  Take or apply over-the-counter and prescription medicines only as told by your health care provider.  Wash exposed skin as soon as possible with soap and cold water.  Use hydrocortisone creams or calamine lotion as needed to soothe the skin and relieve itching.  Take oatmeal baths as needed. Use colloidal oatmeal. You can get this at your local pharmacy or grocery store. Follow the instructions on the packaging.  Do not scratch or rub your skin.  While you have the rash, wash clothes right after you wear them. How is this prevented?  Learn to identify the poison ivy plant and avoid contact with the plant. This plant can be recognized by the number of leaves. Generally, poison ivy has three leaves with flowering branches on a single stem. The leaves are typically glossy, and they have jagged edges that come to a point at the front.  If you have been exposed to poison ivy, thoroughly wash with soap and water right away. You have about 30 minutes to remove the plant resin before it will cause the rash. Be sure to wash under your fingernails because any  plant resin there will continue to spread the rash.  When hiking or camping, wear clothes that will help you to avoid exposure on the skin. This includes long pants, a long-sleeved shirt, tall socks, and hiking boots. You can also apply preventive lotion to your skin to help limit exposure.  If you suspect that your clothes or outdoor gear came in contact with poison ivy, rinse them off outside  with a garden hose before you bring them inside your house. Contact a health care provider if:  You have open sores in the rash area.  You have more redness, swelling, or pain in the affected area.  You have redness that spreads beyond the rash area.  You have fluid, blood, or pus coming from the affected area.  You have a fever.  You have a rash over a large area of your body.  You have a rash on your eyes, mouth, or genitals.  Your rash does not improve after a few days. Get help right away if:  Your face swells or your eyes swell shut.  You have trouble breathing.  You have trouble swallowing. This information is not intended to replace advice given to you by your health care provider. Make sure you discuss any questions you have with your health care provider. Document Released: 06/16/2000 Document Revised: 11/25/2015 Document Reviewed: 11/25/2014 Elsevier Interactive Patient Education  [employer].

## 2017-03-01 ENCOUNTER — Encounter: Payer: Self-pay | Admitting: Nurse Practitioner

## 2017-05-28 ENCOUNTER — Other Ambulatory Visit: Payer: Self-pay | Admitting: Family Medicine

## 2017-05-28 NOTE — Telephone Encounter (Signed)
This +3 refills will need office visit specific for this medication

## 2017-05-28 NOTE — Telephone Encounter (Signed)
Last seen 02/28/17

## 2017-07-10 ENCOUNTER — Telehealth: Payer: Self-pay | Admitting: Family Medicine

## 2017-07-10 NOTE — Telephone Encounter (Signed)
We could see her this Thursday which is my preference-if patient cannot come Thursday then on Monday

## 2017-07-10 NOTE — Telephone Encounter (Signed)
Appointment scheduled with patient

## 2017-07-10 NOTE — Telephone Encounter (Signed)
Patient states she has no thoughts of hurting herself or others

## 2017-07-10 NOTE — Telephone Encounter (Signed)
Patient said she is having a lot of trouble sleeping, she was crying on the phone, and her therapist thinks that her anti-depressant needs to be adjusted.  She doesn't want to wait until next Friday to see Dr. Nicki Reaper, she would like to be seen sooner.  Can we work her in sooner?

## 2017-07-10 NOTE — Telephone Encounter (Signed)
Left message to return call 

## 2017-07-12 ENCOUNTER — Encounter: Payer: Self-pay | Admitting: Family Medicine

## 2017-07-12 ENCOUNTER — Ambulatory Visit: Payer: BLUE CROSS/BLUE SHIELD | Admitting: Family Medicine

## 2017-07-12 VITALS — BP 120/80 | Ht 66.0 in | Wt 186.0 lb

## 2017-07-12 DIAGNOSIS — J019 Acute sinusitis, unspecified: Secondary | ICD-10-CM | POA: Diagnosis not present

## 2017-07-12 DIAGNOSIS — F321 Major depressive disorder, single episode, moderate: Secondary | ICD-10-CM

## 2017-07-12 MED ORDER — AMOXICILLIN 500 MG PO TABS: 500.0000 mg | ORAL_TABLET | Freq: Three times a day (TID) | ORAL | 0 refills | Status: DC

## 2017-07-12 MED ORDER — TRAZODONE HCL 50 MG PO TABS: 25.0000 mg | ORAL_TABLET | Freq: Every evening | ORAL | 3 refills | Status: DC | PRN

## 2017-07-12 NOTE — Progress Notes (Signed)
   Subjective:    Patient ID: [name], female    DOB: 1960/12/29, 57 y.o.   MRN: [id number]  HPI Patient is here today to follow up on depression. She is on Lexapro 20 mg one daily. She does not think this is working for her. Patient under a severe amount of stress she lost her mother-in-law to COPD and kidney failure approximately 5 months ago she lost her mother to the dementia and sepsis several months ago she is in the middle of a lawsuit trying to get her retirement before wrongful firing She also recently found out her husband wants to leave her all of this is made her very stressed depressed crying she denies being suicidal she finds herself feeling down negative.  She is going through counseling.  She does take her medicine on a regular basis.  PMH benign The patient also has significant sinus symptoms head congestion sinus pressure pain discomfort denies high fever chills wheezing Review of Systems  Constitutional: Negative for activity change and appetite change.  HENT: Negative for congestion.   Respiratory: Negative for cough.   Cardiovascular: Negative for chest pain.  Gastrointestinal: Negative for abdominal pain and vomiting.  Skin: Negative for color change.  Neurological: Negative for weakness.  Psychiatric/Behavioral: Negative for confusion.       Objective:   Physical Exam  Constitutional: She appears well-developed and well-nourished. No distress.  HENT:  Head: Normocephalic and atraumatic.  Eyes: Right eye exhibits no discharge. Left eye exhibits no discharge.  Neck: No tracheal deviation present.  Cardiovascular: Normal rate, regular rhythm and normal heart sounds.  No murmur heard. Pulmonary/Chest: Effort normal and breath sounds normal. No respiratory distress. She has no wheezes. She has no rales.  Musculoskeletal: She exhibits no edema.  Lymphadenopathy:    She has no cervical adenopathy.  Neurological: She is alert. She exhibits normal muscle  tone.  Skin: Skin is warm and dry. No erythema.  Psychiatric: Her behavior is normal.  Vitals reviewed.   Moderate sinus tenderness      Assessment & Plan:  Moderate to severe depression patient not suicidal We will go ahead and add trazodone to try to help her with her sleep I highly recommend she continue with her counseling If progressive troubles or if worse follow-up immediately otherwise follow-up in 2 weeks  Patient was seen today for upper respiratory illness. It is felt that the patient is dealing with sinusitis. Antibiotics were prescribed today. Importance of compliance with medication was discussed. Symptoms should gradually resolve over the course of the next several days. If high fevers, progressive illness, difficulty breathing, worsening condition or failure for symptoms to improve over the next several days then the patient is to follow-up. If any emergent conditions the patient is to follow-up in the emergency department otherwise to follow-up in the office.  Give [place] feedback next week on how she is doing Continue the Lexapro on a regular basis. Avoid nerve medication patient does not want anything addictive

## 2017-07-26 ENCOUNTER — Encounter: Payer: Self-pay | Admitting: Family Medicine

## 2017-07-26 ENCOUNTER — Ambulatory Visit: Payer: BLUE CROSS/BLUE SHIELD | Admitting: Family Medicine

## 2017-07-26 VITALS — BP 118/72 | Ht 66.0 in | Wt 183.8 lb

## 2017-07-26 DIAGNOSIS — Z79899 Other long term (current) drug therapy: Secondary | ICD-10-CM

## 2017-07-26 DIAGNOSIS — E7849 Other hyperlipidemia: Secondary | ICD-10-CM | POA: Diagnosis not present

## 2017-07-26 DIAGNOSIS — Z13220 Encounter for screening for lipoid disorders: Secondary | ICD-10-CM | POA: Diagnosis not present

## 2017-07-26 DIAGNOSIS — Z131 Encounter for screening for diabetes mellitus: Secondary | ICD-10-CM

## 2017-07-26 MED ORDER — TRAZODONE HCL 100 MG PO TABS: 100.0000 mg | ORAL_TABLET | Freq: Every evening | ORAL | 5 refills | Status: DC | PRN

## 2017-07-26 NOTE — Progress Notes (Signed)
   Subjective:    Patient ID: [name], female    DOB: April 13, 1961, 57 y.o.   MRN: [id number]  HPI Patient arrives for a follow up on depression.  Patient states she is feeling some better.  Patient not suicidal Is going through counseling Trazodone helping some with sleep but not a lot She would like to try higher dose of trazodone She understands the importance of taking her medicine on a regular basis Unfortunately patient undergoing multiple stressors issues including loss of lungs within the past year, all wrongful termination at work, lawsuit regarding this, also her husband has stated that he is not happy and is leaving Review of Systems     Objective:   Physical Exam 15 minutes was spent with patient today discussing healthcare issues which they came. Greater than half of the discussion was answering questions and giving management guidance.        Assessment & Plan:  The patient was seen today in followup for depression. Discussion was held regarding the importance of taking the medication. Discussion was also held regarding the importance of notifying us if becoming more depressed and certainly emergently notifying us or going to the emergency department if becoming suicidal.  Importance of social engagement was also discussed along with behavioral techniques to lessen depression. Regular followup visits were discussed as well. Trazodone will be increased to 100 mg we will see if this will help with sleep as well as depression she will give [place] a update in 2-3 weeks she will also follow-up office visit within 2 months

## 2017-08-30 ENCOUNTER — Ambulatory Visit: Payer: BLUE CROSS/BLUE SHIELD | Admitting: Gastroenterology

## 2017-08-30 ENCOUNTER — Encounter: Payer: Self-pay | Admitting: Gastroenterology

## 2017-08-30 VITALS — BP 118/70 | HR 78 | Ht 66.0 in | Wt 185.0 lb

## 2017-08-30 DIAGNOSIS — Z86010 Personal history of colonic polyps: Secondary | ICD-10-CM

## 2017-08-30 DIAGNOSIS — K6289 Other specified diseases of anus and rectum: Secondary | ICD-10-CM

## 2017-08-30 MED ORDER — PEG-KCL-NACL-NASULF-NA ASC-C 140 G PO SOLR: 140.0000 g | ORAL | 0 refills | Status: DC

## 2017-08-30 NOTE — Patient Instructions (Signed)
If you are age 57 or older, your body mass index should be between 23-30. Your Body mass index is 29.86 kg/m. If this is out of the aforementioned range listed, please consider follow up with your Primary Care Provider.  If you are age 59 or younger, your body mass index should be between 19-25. Your Body mass index is 29.86 kg/m. If this is out of the aformentioned range listed, please consider follow up with your Primary Care Provider.   You have been scheduled for a colonoscopy. Please follow written instructions given to you at your visit today.  Please pick up your prep supplies at the pharmacy within the next 1-3 days. If you use inhalers (even only as needed), please bring them with you on the day of your procedure. Your physician has requested that you go to www.startemmi.com and enter the access code given to you at your visit today. This web site gives a general overview about your procedure. However, you should still follow specific instructions given to you by our office regarding your preparation for the procedure.  Thank you for choosing [facility]  Dr Wilfrid Lund III

## 2017-08-30 NOTE — Progress Notes (Signed)
[facility] Consult Note:  History: GERALDYN SHAIN 08/30/2017  Referring physician: Hoyt Koch, MD  Reason for consult/chief complaint: Possible rectal mass (no rectal pain or bleeding, GYN Dr refered here)   Subjective  HPI:  This is a 57 year old woman referred by her gynecologist in [place] regarding findings of a anorectal lesion on rectal exam during routine gynecologic evaluation. Jackelyn Poling was last seen here for colonoscopy with Dr. Sharlett Iles in 2014, at which time a 2 mm tubular adenoma was removed and a hypertrophied anal papilla was described and photographed. She denies abdominal pain, altered bowel habits or rectal bleeding. She denies dysphagia vomiting or weight loss.  ROS:  Review of Systems No chest pain dyspnea or dysuria Depression under good control  Past Medical History: Past Medical History:  Diagnosis Date  . Depression with anxiety   . Endometriosis   . Fibrocystic breast    bilateral  . Hyperlipemia   . Insomnia   . Irritable bowel syndrome   . Prediabetes      Past Surgical History: Past Surgical History:  Procedure Laterality Date  . CERVIX LESION DESTRUCTION  2000  . CESAREAN SECTION  1995  . COLONOSCOPY    . COLPOSCOPY    . laproscopic   1993  . SHOULDER ARTHROSCOPY Right 2009     Family History: Family History  Problem Relation Age of Onset  . Ovarian cancer Maternal Grandmother   . Lung cancer Maternal Grandmother   . Diabetes Mother   . Stroke Mother   . Hypertension Mother   . Diabetes Father   . Heart disease Father   . Diabetes Brother   . Hypertension Brother   . Heart disease Brother   . Diabetes Maternal Aunt   . Heart disease Maternal Aunt   . Stroke Maternal Aunt   . Heart disease Maternal Grandfather   . Heart disease Paternal Grandfather   . Heart disease Maternal Aunt        x2  . Asthma Maternal Aunt   . Diabetes Brother   . Heart disease Brother   . Hypertension  Brother   . Colon cancer Neg Hx   . Esophageal cancer Neg Hx   . Rectal cancer Neg Hx     Social History: Social History   Socioeconomic History  . Marital status: Married    Spouse name: None  . Number of children: 1  . Years of education: None  . Highest education level: None  Social Needs  . Financial resource strain: None  . Food insecurity - worry: None  . Food insecurity - inability: None  . Transportation needs - medical: None  . Transportation needs - non-medical: None  Occupational History  . Occupation: Surveyor, mining Asst.-retired    Employer: [employer]  Tobacco Use  . Smoking status: Never Smoker  . Smokeless tobacco: Never Used  Substance and Sexual Activity  . Alcohol use: No  . Drug use: No  . Sexual activity: None  Other Topics Concern  . None  Social History Narrative  . None    Allergies: No Known Allergies  Outpatient Meds: Current Outpatient Medications  Medication Sig Dispense Refill  . escitalopram (LEXAPRO) 20 MG tablet TAKE 1 TABLET BY MOUTH ONCE DAILY. 30 tablet 3  . pantoprazole (PROTONIX) 40 MG tablet Take 1 tablet (40 mg total) by mouth daily. 30 tablet 3  . pravastatin (PRAVACHOL) 20 MG tablet Take 1 tablet (20 mg total) by mouth daily. [place]  tablet 2  . traZODone (DESYREL) 100 MG tablet Take 1 tablet (100 mg total) by mouth at bedtime as needed for sleep. 30 tablet 5  . PEG-KCl-NaCl-NaSulf-Na Asc-C (PLENVU) 140 g SOLR Take 140 g by mouth as directed. 1 each 0   No current facility-administered medications for this visit.       ___________________________________________________________________ Objective   Exam:  BP 118/70   Pulse 78   Ht 5\' 6"  (1.676 m)   Wt 185 lb (83.9 kg)   BMI 29.86 kg/m    General: this is a(n) well-appearing woman   Eyes: sclera anicteric, no redness  ENT: oral mucosa moist without lesions, no cervical or supraclavicular lymphadenopathy, good dentition  CV: RRR without murmur,  S1/S2, no JVD, no peripheral edema  Resp: clear to auscultation bilaterally, normal RR and effort noted  GI: soft, no  tenderness, with active bowel sounds. No guarding or palpable organomegaly noted.  Skin; warm and dry, no rash or jaundice noted  Neuro: awake, alert and oriented x 3. Normal gross motor function and fluent speech Rectal: Normal external, palpable soft rubbery proximal anal lesion at about the 6:00 position.    Assessment: Encounter Diagnoses  Name Primary?  . Hypertrophied anal papilla Yes  . Personal history of colonic polyps     This is a benign chronic finding for her.   She needs a colonoscopy for history of adenomatous polyp nearly 5 years ago. She is agreeable after discussion of procedure and risks.   The benefits and risks of the planned procedure were described in detail with the patient or (when appropriate) their health care proxy.  Risks were outlined as including, but not limited to, bleeding, infection, perforation, adverse medication reaction leading to cardiac or pulmonary decompensation, or pancreatitis (if ERCP).  The limitation of incomplete mucosal visualization was also discussed.  No guarantees or warranties were given.   Thank you for the courtesy of this consult.  Please call me with any questions or concerns.  Nelida Meuse III  CC: Kathyrn Drown, MD

## 2017-09-18 ENCOUNTER — Encounter: Payer: Self-pay | Admitting: Gastroenterology

## 2017-09-24 ENCOUNTER — Ambulatory Visit: Payer: BLUE CROSS/BLUE SHIELD | Admitting: Family Medicine

## 2017-10-02 ENCOUNTER — Encounter: Payer: BLUE CROSS/BLUE SHIELD | Admitting: Gastroenterology

## 2017-10-18 ENCOUNTER — Other Ambulatory Visit: Payer: Self-pay | Admitting: Family Medicine

## 2017-10-30 ENCOUNTER — Telehealth: Payer: Self-pay | Admitting: Family Medicine

## 2017-10-30 DIAGNOSIS — M722 Plantar fascial fibromatosis: Secondary | ICD-10-CM

## 2017-10-30 NOTE — Telephone Encounter (Signed)
Patient has a lot of problem with Planter Fasciitis and would like to be referred to  [facility] where her daughter works. They have special treatments there to help with the pain.  She doesn't need an appointment and she doesn't need a referral with her insurance because insurance doesn't cover it.  She just needs a referral from her PCP before they will let her schedule an appt.  Said she has seen Hoyle Sauer for this in the past.  Phone [phone] Fax [phone]

## 2017-10-30 NOTE — Telephone Encounter (Signed)
Please then notify patient this was done

## 2017-10-30 NOTE — Telephone Encounter (Signed)
Nurses-please go ahead and call this facility to give them referral for this apparently does not need any other official referral aspect thank you

## 2017-10-31 NOTE — Telephone Encounter (Signed)
Spoke with [facility] who stated they need an official referral before patient can have an appointment. Referral ordered in Epic.

## 2017-11-01 LAB — BASIC METABOLIC PANEL
BUN / CREAT RATIO: 18 (ref 9–23)
BUN: 18 mg/dL (ref 6–24)
CALCIUM: 9.3 mg/dL (ref 8.7–10.2)
CHLORIDE: 101 mmol/L (ref 96–106)
CO2: 22 mmol/L (ref 20–29)
CREATININE: 1.02 mg/dL — AB (ref 0.57–1.00)
GFR calc Af Amer: 71 mL/min/{1.73_m2} (ref >59)
GFR calc non Af Amer: 62 mL/min/{1.73_m2} (ref >59)
GLUCOSE: 93 mg/dL (ref 65–99)
Potassium: 4.8 mmol/L (ref 3.5–5.2)
Sodium: 140 mmol/L (ref 134–144)

## 2017-11-01 LAB — HEPATIC FUNCTION PANEL
ALT: 15 IU/L (ref 0–32)
AST: 19 IU/L (ref 0–40)
Albumin: 4.4 g/dL (ref 3.5–5.5)
Alkaline Phosphatase: 72 IU/L (ref 39–117)
Bilirubin Total: 0.3 mg/dL (ref 0.0–1.2)
Bilirubin, Direct: 0.08 mg/dL (ref 0.00–0.40)
TOTAL PROTEIN: 7 g/dL (ref 6.0–8.5)

## 2017-11-01 LAB — LIPID PANEL
Chol/HDL Ratio: 3.8 ratio (ref 0.0–4.4)
Cholesterol, Total: 253 mg/dL — ABNORMAL HIGH (ref 100–199)
HDL: 67 mg/dL (ref >39)
LDL CALC: 164 mg/dL — AB (ref 0–99)
TRIGLYCERIDES: 108 mg/dL (ref 0–149)
VLDL Cholesterol Cal: 22 mg/dL (ref 5–40)

## 2017-11-05 ENCOUNTER — Other Ambulatory Visit: Payer: Self-pay | Admitting: Family Medicine

## 2017-11-05 DIAGNOSIS — E785 Hyperlipidemia, unspecified: Secondary | ICD-10-CM

## 2017-11-05 DIAGNOSIS — Z79899 Other long term (current) drug therapy: Secondary | ICD-10-CM

## 2017-11-05 MED ORDER — PRAVASTATIN SODIUM 20 MG PO TABS: 20.0000 mg | ORAL_TABLET | Freq: Every day | ORAL | 4 refills | Status: DC

## 2017-11-12 ENCOUNTER — Encounter: Payer: BLUE CROSS/BLUE SHIELD | Admitting: Gastroenterology

## 2017-11-12 ENCOUNTER — Telehealth: Payer: Self-pay | Admitting: Gastroenterology

## 2017-11-12 NOTE — Telephone Encounter (Signed)
Patient canceled procedure for today stating she had a family emergency over the weekend and she will not make appt today. Patient states she will cb to reschedule.

## 2017-11-12 NOTE — Telephone Encounter (Signed)
No late cancel charge this time. She can reschedule.

## 2017-11-15 ENCOUNTER — Encounter: Payer: Self-pay | Admitting: Gastroenterology

## 2017-12-18 ENCOUNTER — Encounter: Payer: Self-pay | Admitting: Family Medicine

## 2017-12-18 NOTE — Telephone Encounter (Signed)
Left message to return call to get more information 

## 2018-01-24 ENCOUNTER — Other Ambulatory Visit: Payer: Self-pay | Admitting: Family Medicine

## 2018-02-20 ENCOUNTER — Encounter: Payer: Self-pay | Admitting: Family Medicine

## 2018-02-20 ENCOUNTER — Ambulatory Visit: Payer: BLUE CROSS/BLUE SHIELD | Admitting: Family Medicine

## 2018-02-20 VITALS — BP 128/80 | Ht 66.0 in | Wt 175.1 lb

## 2018-02-20 DIAGNOSIS — Z1159 Encounter for screening for other viral diseases: Secondary | ICD-10-CM | POA: Diagnosis not present

## 2018-02-20 DIAGNOSIS — Z79899 Other long term (current) drug therapy: Secondary | ICD-10-CM | POA: Diagnosis not present

## 2018-02-20 DIAGNOSIS — Z114 Encounter for screening for human immunodeficiency virus [HIV]: Secondary | ICD-10-CM

## 2018-02-20 DIAGNOSIS — E7849 Other hyperlipidemia: Secondary | ICD-10-CM | POA: Diagnosis not present

## 2018-02-20 MED ORDER — PANTOPRAZOLE SODIUM 40 MG PO TBEC: 40.0000 mg | DELAYED_RELEASE_TABLET | Freq: Every day | ORAL | 5 refills | Status: DC

## 2018-02-20 MED ORDER — PRAVASTATIN SODIUM 20 MG PO TABS: 20.0000 mg | ORAL_TABLET | Freq: Every day | ORAL | 4 refills | Status: DC

## 2018-02-20 MED ORDER — ESCITALOPRAM OXALATE 20 MG PO TABS: 20.0000 mg | ORAL_TABLET | Freq: Every day | ORAL | 5 refills | Status: DC

## 2018-02-20 MED ORDER — TRAZODONE HCL 100 MG PO TABS: 100.0000 mg | ORAL_TABLET | Freq: Every evening | ORAL | 5 refills | Status: DC | PRN

## 2018-02-20 NOTE — Progress Notes (Signed)
   Subjective:    Patient ID: [name], female    DOB: Mar 02, 1961, 57 y.o.   MRN: [id number]  HPI Patient is here today to follow up on her chronic health conditions. She eats healthy and exercises four times per week. She sees a clinical therapist.  Patient suffers with insomnia.  This is been going on for a while.  The patient finds it necessary to use medication to help sleep.  Patient finds it if not using medication has significant troubles.  Denies abusing the medication.  Denies any negative side effects.  Patient here for follow-up regarding cholesterol.  The patient does have hyperlipidemia.  Patient does try to maintain a reasonable diet.  Patient does take the medication on a regular basis.  Denies missing a dose.  The patient denies any obvious side effects.  Prior blood work results reviewed with the patient.  The patient is aware of his cholesterol goals and the need to keep it under good control to lessen the risk of disease.  Patient under a mild amount of stress she does see a therapist she does take her Lexapro it does seem to help she is currently involved in a legal issue but she does try to maintain a upbeat approach and does try to maintain some interactions with others  She states her reflux under good control ever since exercising and watching her diet  She does follow with dermatology on a regular basis for monitoring for melanoma  Review of Systems  Constitutional: Negative for activity change, appetite change and fatigue.  HENT: Negative for congestion and rhinorrhea.   Respiratory: Negative for cough and shortness of breath.   Cardiovascular: Negative for chest pain and leg swelling.  Gastrointestinal: Negative for abdominal pain and diarrhea.  Endocrine: Negative for polydipsia and polyphagia.  Skin: Negative for color change.  Neurological: Negative for dizziness and weakness.  Psychiatric/Behavioral: Negative for behavioral problems and confusion.        Objective:   Physical Exam  Constitutional: She appears well-nourished. No distress.  HENT:  Head: Normocephalic and atraumatic.  Eyes: Right eye exhibits no discharge. Left eye exhibits no discharge.  Neck: No tracheal deviation present.  Cardiovascular: Normal rate, regular rhythm and normal heart sounds.  No murmur heard. Pulmonary/Chest: Effort normal and breath sounds normal. No respiratory distress.  Musculoskeletal: She exhibits no edema.  Lymphadenopathy:    She has no cervical adenopathy.  Neurological: She is alert. Coordination normal.  Skin: Skin is warm and dry.  Psychiatric: She has a normal mood and affect. Her behavior is normal.  Vitals reviewed.          Assessment & Plan:  The patient was seen today as part of an evaluation regarding hyperlipidemia.  Recent lab work has been reviewed with the patient as well as the goals for good cholesterol care.  In addition to this medications have been discussed the importance of compliance with diet and medications discussed as well.  Finally the patient is aware that poor control of cholesterol, noncompliance can dramatically increase the risk of complications. The patient will keep regular office visits and the patient does agreed to periodic lab work.  Stress related issues patient does benefit from Lexapro she will continue taking this depression under good control continue counseling  Reflux under good control without medication  Pravastatin taking daily watching diet repeat lab work follow-up 6 months  Trazodone as needed for insomnia  Follow-up 6 months

## 2018-02-21 LAB — HEPATIC FUNCTION PANEL
ALK PHOS: 80 IU/L (ref 39–117)
ALT: 21 IU/L (ref 0–32)
AST: 20 IU/L (ref 0–40)
Albumin: 4.6 g/dL (ref 3.5–5.5)
BILIRUBIN TOTAL: 0.3 mg/dL (ref 0.0–1.2)
BILIRUBIN, DIRECT: 0.09 mg/dL (ref 0.00–0.40)
TOTAL PROTEIN: 7.6 g/dL (ref 6.0–8.5)

## 2018-02-21 LAB — LIPID PANEL
CHOLESTEROL TOTAL: 243 mg/dL — AB (ref 100–199)
Chol/HDL Ratio: 3.6 ratio (ref 0.0–4.4)
HDL: 67 mg/dL (ref >39)
LDL Calculated: 154 mg/dL — ABNORMAL HIGH (ref 0–99)
Triglycerides: 108 mg/dL (ref 0–149)
VLDL Cholesterol Cal: 22 mg/dL (ref 5–40)

## 2018-02-21 LAB — HIV ANTIBODY (ROUTINE TESTING W REFLEX): HIV Screen 4th Generation wRfx: NONREACTIVE

## 2018-02-21 LAB — HEPATITIS C ANTIBODY: Hep C Virus Ab: <0.1 s/co ratio (ref 0.0–0.9)

## 2018-02-21 MED ORDER — PRAVASTATIN SODIUM 40 MG PO TABS: 40.0000 mg | ORAL_TABLET | Freq: Every day | ORAL | 5 refills | Status: DC

## 2018-02-21 NOTE — Addendum Note (Signed)
Addended by: Karle Barr on: 02/21/2018 09:36 AM   Modules accepted: Orders

## 2018-04-02 ENCOUNTER — Encounter: Payer: Self-pay | Admitting: Family Medicine

## 2018-08-15 ENCOUNTER — Telehealth: Payer: Self-pay | Admitting: Family Medicine

## 2018-08-15 NOTE — Telephone Encounter (Signed)
Last labs 02/20/18 lipid, liver, hep c and hiv. Pt was told to do repeat lipid and liver end of nov/dec and orders were put in on 02/21/18. Pt did not do. Orders will expire end of the February. Do you want to add any other test

## 2018-08-15 NOTE — Telephone Encounter (Signed)
Pt has 6 month follow up on 2/20 and would like to have lab work done before appt.

## 2018-08-15 NOTE — Telephone Encounter (Signed)
Lipid, liver, metabolic 7 

## 2018-08-16 ENCOUNTER — Other Ambulatory Visit: Payer: Self-pay | Admitting: Family Medicine

## 2018-08-16 DIAGNOSIS — Z79899 Other long term (current) drug therapy: Secondary | ICD-10-CM

## 2018-08-16 DIAGNOSIS — E7849 Other hyperlipidemia: Secondary | ICD-10-CM

## 2018-08-16 NOTE — Telephone Encounter (Signed)
Lab orders placed and pt is aware 

## 2018-08-21 LAB — LIPID PANEL
Chol/HDL Ratio: 3.9 ratio (ref 0.0–4.4)
Cholesterol, Total: 234 mg/dL — ABNORMAL HIGH (ref 100–199)
HDL: 60 mg/dL (ref >39)
LDL Calculated: 147 mg/dL — ABNORMAL HIGH (ref 0–99)
Triglycerides: 137 mg/dL (ref 0–149)
VLDL Cholesterol Cal: 27 mg/dL (ref 5–40)

## 2018-08-21 LAB — BASIC METABOLIC PANEL
BUN/Creatinine Ratio: 23 (ref 9–23)
BUN: 19 mg/dL (ref 6–24)
CO2: 24 mmol/L (ref 20–29)
Calcium: 9.2 mg/dL (ref 8.7–10.2)
Chloride: 105 mmol/L (ref 96–106)
Creatinine, Ser: 0.83 mg/dL (ref 0.57–1.00)
GFR calc Af Amer: 91 mL/min/{1.73_m2} (ref >59)
GFR calc non Af Amer: 79 mL/min/{1.73_m2} (ref >59)
Glucose: 101 mg/dL — ABNORMAL HIGH (ref 65–99)
Potassium: 5 mmol/L (ref 3.5–5.2)
Sodium: 144 mmol/L (ref 134–144)

## 2018-08-21 LAB — HEPATIC FUNCTION PANEL
ALBUMIN: 4.2 g/dL (ref 3.8–4.9)
ALT: 20 IU/L (ref 0–32)
AST: 17 IU/L (ref 0–40)
Alkaline Phosphatase: 77 IU/L (ref 39–117)
Bilirubin Total: 0.2 mg/dL (ref 0.0–1.2)
Bilirubin, Direct: 0.07 mg/dL (ref 0.00–0.40)
Total Protein: 6.8 g/dL (ref 6.0–8.5)

## 2018-08-22 ENCOUNTER — Encounter: Payer: Self-pay | Admitting: Family Medicine

## 2018-08-22 ENCOUNTER — Ambulatory Visit: Payer: BLUE CROSS/BLUE SHIELD | Admitting: Family Medicine

## 2018-08-22 VITALS — BP 122/70 | Wt 186.0 lb

## 2018-08-22 DIAGNOSIS — E7849 Other hyperlipidemia: Secondary | ICD-10-CM | POA: Diagnosis not present

## 2018-08-22 MED ORDER — PRAVASTATIN SODIUM 40 MG PO TABS: 40.0000 mg | ORAL_TABLET | Freq: Every day | ORAL | 5 refills | Status: DC

## 2018-08-22 MED ORDER — PANTOPRAZOLE SODIUM 40 MG PO TBEC: 40.0000 mg | DELAYED_RELEASE_TABLET | Freq: Every day | ORAL | 5 refills | Status: DC

## 2018-08-22 MED ORDER — ESCITALOPRAM OXALATE 20 MG PO TABS: 20.0000 mg | ORAL_TABLET | Freq: Every day | ORAL | 5 refills | Status: DC

## 2018-08-22 NOTE — Progress Notes (Signed)
   Subjective:    Patient ID: [name], female    DOB: 1961-03-11, 58 y.o.   MRN: [id number]  Hyperlipidemia  This is a chronic problem. Pertinent negatives include no chest pain. Compliance problems: pt states she has not been taking Pravastatin as directed.    Pt states she has not taking Pravastatin in several months. Pt states she has been doing well since last visit.  Patient states been under some stress but is doing counseling is taking medication feels she is doing pretty well denies being depressed she has been involved in a long court case which is made difficult for her to move on with her life  She denies any chest tightness pressure pain shortness of breath nausea vomiting diarrhea she understands importance of doing a better job taking care of her self lose weight and also get cholesterol under control  Review of Systems  Constitutional: Negative for activity change, appetite change and fatigue.  HENT: Negative for congestion.   Respiratory: Negative for cough.   Cardiovascular: Negative for chest pain.  Gastrointestinal: Negative for abdominal pain.  Skin: Negative for color change.  Neurological: Negative for headaches.  Psychiatric/Behavioral: Negative for behavioral problems.       Objective:   Physical Exam Vitals signs reviewed.  Constitutional:      General: She is not in acute distress. HENT:     Head: Normocephalic.  Cardiovascular:     Rate and Rhythm: Normal rate and regular rhythm.     Heart sounds: Normal heart sounds. No murmur.  Pulmonary:     Effort: Pulmonary effort is normal.     Breath sounds: Normal breath sounds.  Lymphadenopathy:     Cervical: No cervical adenopathy.  Neurological:     Mental Status: She is alert.  Psychiatric:        Behavior: Behavior normal.           Assessment & Plan:  Hyperlipidemia-restart pravastatin recheck labs before the next visit  Weight issues-patient will work hard at trying to lose some  weight exercise watch diet  Stress levels doing better taking Lexapro doing well does not want to stop it at this point  There is a possibility patient may eventually move down toward [facility] but for now staying around this area

## 2019-02-17 ENCOUNTER — Telehealth: Payer: Self-pay | Admitting: Family Medicine

## 2019-02-17 DIAGNOSIS — R7303 Prediabetes: Secondary | ICD-10-CM

## 2019-02-17 DIAGNOSIS — E785 Hyperlipidemia, unspecified: Secondary | ICD-10-CM

## 2019-02-17 NOTE — Telephone Encounter (Signed)
Pt has virtual 6 month follow up on 8/26 and would like to know if she should have lab work done.

## 2019-02-17 NOTE — Telephone Encounter (Signed)
Last labs 08/20/2018- Lipid, Liver and Met 7

## 2019-02-17 NOTE — Telephone Encounter (Signed)
Orders put in. Pt notified.  

## 2019-02-17 NOTE — Telephone Encounter (Signed)
Lipid, glucose

## 2019-02-20 ENCOUNTER — Ambulatory Visit: Payer: BLUE CROSS/BLUE SHIELD | Admitting: Family Medicine

## 2019-02-25 LAB — LIPID PANEL
Chol/HDL Ratio: 3.6 ratio (ref 0.0–4.4)
Cholesterol, Total: 221 mg/dL — ABNORMAL HIGH (ref 100–199)
HDL: 61 mg/dL (ref >39)
LDL Calculated: 122 mg/dL — ABNORMAL HIGH (ref 0–99)
Triglycerides: 188 mg/dL — ABNORMAL HIGH (ref 0–149)
VLDL Cholesterol Cal: 38 mg/dL (ref 5–40)

## 2019-02-25 LAB — GLUCOSE, RANDOM: Glucose: 100 mg/dL — ABNORMAL HIGH (ref 65–99)

## 2019-02-26 ENCOUNTER — Telehealth: Payer: Self-pay | Admitting: *Deleted

## 2019-02-26 ENCOUNTER — Ambulatory Visit (INDEPENDENT_AMBULATORY_CARE_PROVIDER_SITE_OTHER): Payer: BLUE CROSS/BLUE SHIELD | Admitting: Family Medicine

## 2019-02-26 ENCOUNTER — Other Ambulatory Visit: Payer: Self-pay

## 2019-02-26 ENCOUNTER — Encounter: Payer: Self-pay | Admitting: Family Medicine

## 2019-02-26 VITALS — Ht 66.0 in | Wt 183.0 lb

## 2019-02-26 DIAGNOSIS — R7303 Prediabetes: Secondary | ICD-10-CM

## 2019-02-26 DIAGNOSIS — R635 Abnormal weight gain: Secondary | ICD-10-CM | POA: Diagnosis not present

## 2019-02-26 DIAGNOSIS — E539 Vitamin B deficiency, unspecified: Secondary | ICD-10-CM

## 2019-02-26 DIAGNOSIS — E785 Hyperlipidemia, unspecified: Secondary | ICD-10-CM | POA: Diagnosis not present

## 2019-02-26 DIAGNOSIS — K219 Gastro-esophageal reflux disease without esophagitis: Secondary | ICD-10-CM | POA: Insufficient documentation

## 2019-02-26 DIAGNOSIS — G479 Sleep disorder, unspecified: Secondary | ICD-10-CM

## 2019-02-26 HISTORY — DX: Gastro-esophageal reflux disease without esophagitis: K21.9

## 2019-02-26 MED ORDER — PANTOPRAZOLE SODIUM 40 MG PO TBEC: 40.0000 mg | DELAYED_RELEASE_TABLET | Freq: Every day | ORAL | 5 refills | Status: DC

## 2019-02-26 MED ORDER — ESCITALOPRAM OXALATE 20 MG PO TABS: 20.0000 mg | ORAL_TABLET | Freq: Every day | ORAL | 5 refills | Status: DC

## 2019-02-26 MED ORDER — PRAVASTATIN SODIUM 80 MG PO TABS: 80.0000 mg | ORAL_TABLET | Freq: Every day | ORAL | 5 refills | Status: DC

## 2019-02-26 NOTE — Telephone Encounter (Signed)
Called [facility] and asked if b12 could be added and new diagnosis of burning feet to be added. ICD 10 code R20.8. lab will pull sample and see if it can be added and fax a report to let dr scott know either way. Await fax.

## 2019-02-26 NOTE — Progress Notes (Signed)
Subjective:    Patient ID: [name], female    DOB: September 02, 1960, 58 y.o.   MRN: [id number]  Hyperlipidemia This is a chronic problem. Pertinent negatives include no chest pain or shortness of breath. Treatments tried: pravachol 40mg . There are no compliance problems (eats healthy, exercises 2 -3 times a week, takes meds every day).    Trouble sleeping for several months. Gets about 4 hours of sleep at night and that is off and on.  Burning feet syndrome  Hyperlipidemia, unspecified hyperlipidemia type  Weight gain  Prediabetes  Gastroesophageal reflux disease without esophagitis  Patient have significant weight gain she is trying to watch her diet.  She is frustrated by this.  She wonders if it could be related to her poor sleep.  She is trying to stay active.  She does have prediabetes but does not feel that her numbers are out of control.  Recent glucose look good at 100 she is trying to be healthy with her diet  She does have reflux related issues.  Takes her medication but states she is under good control she is willing to try taking less frequent amounts to see if it will stay under control she will let us know if any problems  Hyperlipidemia LDL is not quite at goal she is willing to go ahead and bump up the dose of the medicine in order to help keep it under control  Generalized anxiety disorder overall doing well denies being depressed takes her medicine states it seemingly is doing well for her she would like to continue it. Virtual Visit via Telephone Note  I connected with [name] on 02/26/19 at 10:00 AM EDT by telephone and verified that I am speaking with the correct person using two identifiers.  Location: Patient: home Provider: office   I discussed the limitations, risks, security and privacy concerns of performing an evaluation and management service by telephone and the availability of in person appointments. I also discussed with the patient  that there may be a patient responsible charge related to this service. The patient expressed understanding and agreed to proceed.   History of Present Illness:    Observations/Objective:   Assessment and Plan:   Follow Up Instructions:    I discussed the assessment and treatment plan with the patient. The patient was provided an opportunity to ask questions and all were answered. The patient agreed with the plan and demonstrated an understanding of the instructions.   The patient was advised to call back or seek an in-person evaluation if the symptoms worsen or if the condition fails to improve as anticipated.  I provided 25 minutes of non-face-to-face time during this encounter.       Review of Systems  Constitutional: Negative for activity change, appetite change, diaphoresis, fatigue and fever.  HENT: Negative for congestion and rhinorrhea.   Respiratory: Negative for cough and shortness of breath.   Cardiovascular: Negative for chest pain and leg swelling.  Gastrointestinal: Negative for abdominal pain and diarrhea.  Endocrine: Negative for polydipsia and polyphagia.  Skin: Negative for color change.  Neurological: Negative for dizziness and weakness.  Psychiatric/Behavioral: Negative for behavioral problems and confusion.       Objective:   Physical Exam Today's visit was via telephone Physical exam was not possible for this visit    25 minutes was spent with the patient.  This statement verifies that 25 minutes was indeed spent with the patient.  More than 50% of this visit-total  duration of the visit-was spent in counseling and coordination of care. The issues that the patient came in for today as reflected in the diagnosis (s) please refer to documentation for further details.     Assessment & Plan:  1. Burning feet syndrome She complains of burning feet.  I recommend checking a B12 level we await the results of this it is possible may have to try something  such as nortriptyline to help her at nighttime hold off on this for now  2. Hyperlipidemia, unspecified hyperlipidemia type Subpar control need could bump up the dose of the medicine patient willing to do so she is also can work hard at watching diet closely check lab work again within 6 months  3. Weight gain Mild weight gain difficult for her to lose weight probably related to her age and being a female she is certainly very nice person is trying hard she will work harder on diet and activity  4. Prediabetes Prediabetes I think this is under good control watch diet closely stay active try to lose weight  5. Gastroesophageal reflux disease without esophagitis Reflux under good control and reduce the medication to lessen the risk of long-term side effects she will try taking it just Monday Wednesday Fridays  Sleep disturbance we did discuss how seen a sleep specialist might be helpful I do not recommend put you on medication to help her sleep at this point she understands this she will let us know if she is interested with the sleep studies and seeing a sleep specialist

## 2019-03-05 LAB — SPECIMEN STATUS REPORT

## 2019-03-07 LAB — SPECIMEN STATUS REPORT

## 2019-03-07 LAB — VITAMIN B12: Vitamin B-12: 470 pg/mL (ref 232–1245)

## 2019-03-12 ENCOUNTER — Other Ambulatory Visit: Payer: Self-pay | Admitting: Family Medicine

## 2019-05-27 ENCOUNTER — Encounter: Payer: Self-pay | Admitting: Family Medicine

## 2019-05-28 NOTE — Telephone Encounter (Signed)
Pt states this has been going on for 6 months or so and just feels like she has to stretch her legs at night when she gets still or get up and walk. Transferred to the front to schedule virtual appt with dr scott at next available appt. Not a same day.

## 2019-06-03 ENCOUNTER — Other Ambulatory Visit: Payer: Self-pay

## 2019-06-03 ENCOUNTER — Ambulatory Visit (INDEPENDENT_AMBULATORY_CARE_PROVIDER_SITE_OTHER): Payer: BLUE CROSS/BLUE SHIELD | Admitting: Family Medicine

## 2019-06-03 DIAGNOSIS — E611 Iron deficiency: Secondary | ICD-10-CM

## 2019-06-03 DIAGNOSIS — G2581 Restless legs syndrome: Secondary | ICD-10-CM

## 2019-06-03 MED ORDER — SHINGRIX 50 MCG/0.5ML IM SUSR: 0.5000 mL | Freq: Once | INTRAMUSCULAR | 1 refills | Status: AC

## 2019-06-03 NOTE — Progress Notes (Signed)
   Subjective:    Patient ID: [name], female    DOB: [birth year], 58 y.o.   MRN: [id number]  HPI Pt is having issues with restless leg. Pt states that on inside of legs it feels like something is running inside legs. Can not get comfortable, feels the need to continually stretch legs. Happens in both legs but more in right. Pt has taken Tylenol and gets up to walk. Pt states this has been going on about 3 months.   Virtual Visit via Video Note  I connected with [name] on 06/03/19 at 11:00 AM EST by a video enabled telemedicine application and verified that I am speaking with the correct person using two identifiers.  Location: Patient: home Provider: office   I discussed the limitations of evaluation and management by telemedicine and the availability of in person appointments. The patient expressed understanding and agreed to proceed.  History of Present Illness:    Observations/Objective:   Assessment and Plan:   Follow Up Instructions:    I discussed the assessment and treatment plan with the patient. The patient was provided an opportunity to ask questions and all were answered. The patient agreed with the plan and demonstrated an understanding of the instructions.   The patient was advised to call back or seek an in-person evaluation if the symptoms worsen or if the condition fails to improve as anticipated.  I provided 20 minutes of non-face-to-face time during this encounter.   Vicente Males, LPN   Review of Systems     Objective:   Physical Exam Patient had virtual visit Appears to be in no distress Atraumatic Neuro able to relate and oriented No apparent resp distress Color normal  Restless legs questionnaire was completed patient at significant severity      Assessment & Plan:  RLS We will send her additional information regarding the medication there is some association with increased risk of melanoma. She will consider this  versus trying a nerve medicine at nighttime Check CBC ferritin Additional information regarding the risk of medication was sent to the patient

## 2019-06-20 LAB — CBC WITH DIFFERENTIAL/PLATELET
Basophils Absolute: 0.1 10*3/uL (ref 0.0–0.2)
Basos: 1 %
EOS (ABSOLUTE): 0.2 10*3/uL (ref 0.0–0.4)
Eos: 2 %
Hematocrit: 37.6 % (ref 34.0–46.6)
Hemoglobin: 12.9 g/dL (ref 11.1–15.9)
Immature Grans (Abs): 0 10*3/uL (ref 0.0–0.1)
Immature Granulocytes: 0 %
Lymphocytes Absolute: 2.8 10*3/uL (ref 0.7–3.1)
Lymphs: 37 %
MCH: 32.1 pg (ref 26.6–33.0)
MCHC: 34.3 g/dL (ref 31.5–35.7)
MCV: 94 fL (ref 79–97)
Monocytes Absolute: 0.8 10*3/uL (ref 0.1–0.9)
Monocytes: 10 %
Neutrophils Absolute: 3.9 10*3/uL (ref 1.4–7.0)
Neutrophils: 50 %
Platelets: 253 10*3/uL (ref 150–450)
RBC: 4.02 x10E6/uL (ref 3.77–5.28)
RDW: 12.6 % (ref 11.7–15.4)
WBC: 7.7 10*3/uL (ref 3.4–10.8)

## 2019-06-20 LAB — FERRITIN: Ferritin: 248 ng/mL — ABNORMAL HIGH (ref 15–150)

## 2019-06-30 ENCOUNTER — Other Ambulatory Visit: Payer: Self-pay | Admitting: Family Medicine

## 2019-06-30 DIAGNOSIS — E611 Iron deficiency: Secondary | ICD-10-CM

## 2019-07-07 ENCOUNTER — Encounter: Payer: Self-pay | Admitting: Family Medicine

## 2019-07-15 ENCOUNTER — Encounter: Payer: Self-pay | Admitting: Family Medicine

## 2019-07-22 LAB — HEMOGLOBIN AND HEMATOCRIT, BLOOD
Hematocrit: 40.2 % (ref 34.0–46.6)
Hemoglobin: 13.4 g/dL (ref 11.1–15.9)

## 2019-07-22 LAB — IRON AND TIBC
Iron Saturation: 31 % (ref 15–55)
Iron: 97 ug/dL (ref 27–159)
Total Iron Binding Capacity: 311 ug/dL (ref 250–450)
UIBC: 214 ug/dL (ref 131–425)

## 2019-07-22 LAB — FERRITIN: Ferritin: 290 ng/mL — ABNORMAL HIGH (ref 15–150)

## 2019-07-29 ENCOUNTER — Encounter: Payer: Self-pay | Admitting: Family Medicine

## 2019-07-30 ENCOUNTER — Telehealth: Payer: Self-pay | Admitting: Family Medicine

## 2019-07-30 NOTE — Telephone Encounter (Signed)
Please put into the reminder file for August 2021 for the patient to do TIBC, ferritin, serum iron, liver profile, hemoglobin hematocrit due to elevated ferritin

## 2019-07-31 NOTE — Telephone Encounter (Signed)
Labs placed in reminder file

## 2019-09-29 ENCOUNTER — Other Ambulatory Visit: Payer: Self-pay | Admitting: Family Medicine

## 2019-11-05 ENCOUNTER — Other Ambulatory Visit: Payer: Self-pay | Admitting: Family Medicine

## 2019-11-05 NOTE — Telephone Encounter (Signed)
This plus to follow-up in the summer

## 2019-11-10 ENCOUNTER — Other Ambulatory Visit: Payer: Self-pay | Admitting: Family Medicine

## 2019-11-10 NOTE — Telephone Encounter (Signed)
This +4 refills

## 2019-11-10 NOTE — Telephone Encounter (Signed)
Last med check up 02/26/19

## 2020-02-02 ENCOUNTER — Other Ambulatory Visit: Payer: Self-pay | Admitting: Family Medicine

## 2020-02-02 NOTE — Telephone Encounter (Signed)
lvm to schedule appt.  

## 2020-02-02 NOTE — Telephone Encounter (Signed)
Please call patient to set up appt then may route back to nurses. Thank you

## 2020-02-03 NOTE — Telephone Encounter (Signed)
Scheduled 8/30

## 2020-02-27 ENCOUNTER — Other Ambulatory Visit: Payer: Self-pay | Admitting: *Deleted

## 2020-02-27 DIAGNOSIS — E611 Iron deficiency: Secondary | ICD-10-CM

## 2020-03-01 ENCOUNTER — Ambulatory Visit (INDEPENDENT_AMBULATORY_CARE_PROVIDER_SITE_OTHER): Payer: BLUE CROSS/BLUE SHIELD | Admitting: Family Medicine

## 2020-03-01 ENCOUNTER — Other Ambulatory Visit: Payer: Self-pay

## 2020-03-01 DIAGNOSIS — K219 Gastro-esophageal reflux disease without esophagitis: Secondary | ICD-10-CM

## 2020-03-01 DIAGNOSIS — E785 Hyperlipidemia, unspecified: Secondary | ICD-10-CM | POA: Diagnosis not present

## 2020-03-01 DIAGNOSIS — R5383 Other fatigue: Secondary | ICD-10-CM

## 2020-03-01 DIAGNOSIS — R0981 Nasal congestion: Secondary | ICD-10-CM

## 2020-03-01 DIAGNOSIS — Z79899 Other long term (current) drug therapy: Secondary | ICD-10-CM

## 2020-03-01 DIAGNOSIS — R6881 Early satiety: Secondary | ICD-10-CM | POA: Diagnosis not present

## 2020-03-01 DIAGNOSIS — K5900 Constipation, unspecified: Secondary | ICD-10-CM

## 2020-03-01 NOTE — Progress Notes (Signed)
   Subjective:    Patient ID: [name], female    DOB: 07-Sep-1960, 59 y.o.   MRN: [id number]  Hyperlipidemia This is a chronic problem. Pertinent negatives include no chest pain or shortness of breath. Treatments tried: Pravastatin. There are no compliance problems.    Patient also reports congestion, sinus drainage, headaches for 1 week and slight dizziness Thursday. Taken flonase and sinex with no improvement.   Hyperlipidemia, unspecified hyperlipidemia type - Plan: Lipid panel  Gastroesophageal reflux disease without esophagitis  Sinus congestion - Plan: Novel Coronavirus, NAA ([facility])  [name] satiety  Constipation, unspecified constipation type  Other fatigue - Plan: TSH, Basic metabolic panel, T4, free  High risk medication use - Plan: Hepatic function panel    Review of Systems  Constitutional: Negative for activity change, appetite change and fatigue.  HENT: Positive for congestion and sinus pressure. Negative for rhinorrhea.   Respiratory: Negative for cough and shortness of breath.   Cardiovascular: Negative for chest pain and leg swelling.  Gastrointestinal: Negative for abdominal pain and diarrhea.  Endocrine: Negative for polydipsia and polyphagia.  Skin: Negative for color change.  Neurological: Negative for dizziness and weakness.  Psychiatric/Behavioral: Negative for behavioral problems and confusion.       Objective:   Physical Exam Vitals reviewed.  Constitutional:      General: She is not in acute distress. HENT:     Head: Normocephalic and atraumatic.  Eyes:     General:        Right eye: No discharge.        Left eye: No discharge.  Neck:     Trachea: No tracheal deviation.  Cardiovascular:     Rate and Rhythm: Normal rate and regular rhythm.     Heart sounds: Normal heart sounds. No murmur heard.   Pulmonary:     Effort: Pulmonary effort is normal. No respiratory distress.     Breath sounds: Normal breath sounds.    Lymphadenopathy:     Cervical: No cervical adenopathy.  Skin:    General: Skin is warm and dry.  Neurological:     Mental Status: She is alert.     Coordination: Coordination normal.  Psychiatric:        Behavior: Behavior normal.           Assessment & Plan:  1. Hyperlipidemia, unspecified hyperlipidemia type Lab work ordered await results - Lipid panel  2. Gastroesophageal reflux disease without esophagitis Takes her medicine regular basis does well.  Under good control  3. Sinus congestion More than likely environmental recommend allergy medicines but go ahead with Covid testing - Novel Coronavirus, NAA ([facility])  4. [name] satiety Onset over the past several months recommend referral to gastroenterology [facility]  5. Constipation, unspecified constipation type Patient due for colonoscopy gastroenterology [facility]  6. Other fatigue To some degree this is occurring because of insomnia issues sleep pamphlet given - TSH - Basic metabolic panel - T4, free  7. High risk medication use Liver function ordered - Hepatic function panel

## 2020-03-02 NOTE — Addendum Note (Signed)
Addended by: Dairl Ponder on: 03/02/2020 11:58 AM   Modules accepted: Orders

## 2020-03-03 LAB — NOVEL CORONAVIRUS, NAA: SARS-CoV-2, NAA: NOT DETECTED

## 2020-03-03 LAB — SPECIMEN STATUS REPORT

## 2020-03-04 ENCOUNTER — Encounter: Payer: Self-pay | Admitting: Family Medicine

## 2020-03-16 ENCOUNTER — Other Ambulatory Visit: Payer: Self-pay | Admitting: Family Medicine

## 2020-03-18 LAB — BASIC METABOLIC PANEL
BUN/Creatinine Ratio: 23 (ref 9–23)
BUN: 21 mg/dL (ref 6–24)
CO2: 20 mmol/L (ref 20–29)
Calcium: 9.4 mg/dL (ref 8.7–10.2)
Chloride: 105 mmol/L (ref 96–106)
Creatinine, Ser: 0.9 mg/dL (ref 0.57–1.00)
GFR calc Af Amer: 82 mL/min/{1.73_m2} (ref >59)
GFR calc non Af Amer: 71 mL/min/{1.73_m2} (ref >59)
Glucose: 97 mg/dL (ref 65–99)
Potassium: 4.5 mmol/L (ref 3.5–5.2)
Sodium: 140 mmol/L (ref 134–144)

## 2020-03-18 LAB — HEPATIC FUNCTION PANEL
ALT: 30 IU/L (ref 0–32)
AST: 24 IU/L (ref 0–40)
Albumin: 4.3 g/dL (ref 3.8–4.9)
Alkaline Phosphatase: 95 IU/L (ref 44–121)
Bilirubin Total: 0.4 mg/dL (ref 0.0–1.2)
Bilirubin, Direct: <0.1 mg/dL (ref 0.00–0.40)
Total Protein: 6.9 g/dL (ref 6.0–8.5)

## 2020-03-18 LAB — LIPID PANEL
Chol/HDL Ratio: 4.6 ratio — ABNORMAL HIGH (ref 0.0–4.4)
Cholesterol, Total: 253 mg/dL — ABNORMAL HIGH (ref 100–199)
HDL: 55 mg/dL (ref >39)
LDL Chol Calc (NIH): 166 mg/dL — ABNORMAL HIGH (ref 0–99)
Triglycerides: 174 mg/dL — ABNORMAL HIGH (ref 0–149)
VLDL Cholesterol Cal: 32 mg/dL (ref 5–40)

## 2020-03-18 LAB — TSH: TSH: 1.33 u[IU]/mL (ref 0.450–4.500)

## 2020-03-18 LAB — T4, FREE: Free T4: 1.15 ng/dL (ref 0.82–1.77)

## 2020-04-06 ENCOUNTER — Other Ambulatory Visit: Payer: Self-pay | Admitting: Family Medicine

## 2020-04-15 ENCOUNTER — Other Ambulatory Visit: Payer: Self-pay | Admitting: Family Medicine

## 2020-04-26 ENCOUNTER — Other Ambulatory Visit: Payer: Self-pay | Admitting: Family Medicine

## 2020-04-26 ENCOUNTER — Encounter: Payer: Self-pay | Admitting: Family Medicine

## 2020-04-26 DIAGNOSIS — Z1211 Encounter for screening for malignant neoplasm of colon: Secondary | ICD-10-CM

## 2020-05-05 ENCOUNTER — Other Ambulatory Visit: Payer: Self-pay | Admitting: Family Medicine

## 2020-05-25 ENCOUNTER — Other Ambulatory Visit: Payer: Self-pay | Admitting: Family Medicine

## 2020-06-24 ENCOUNTER — Other Ambulatory Visit: Payer: Self-pay | Admitting: Family Medicine

## 2020-07-16 ENCOUNTER — Other Ambulatory Visit: Payer: Self-pay | Admitting: Family Medicine

## 2020-08-24 ENCOUNTER — Other Ambulatory Visit: Payer: Self-pay | Admitting: Family Medicine

## 2020-09-30 ENCOUNTER — Telehealth: Payer: Self-pay | Admitting: Family Medicine

## 2020-09-30 DIAGNOSIS — K219 Gastro-esophageal reflux disease without esophagitis: Secondary | ICD-10-CM

## 2020-09-30 DIAGNOSIS — F321 Major depressive disorder, single episode, moderate: Secondary | ICD-10-CM

## 2020-09-30 DIAGNOSIS — G2581 Restless legs syndrome: Secondary | ICD-10-CM

## 2020-10-03 NOTE — Telephone Encounter (Signed)
4 months on meds follow-up by August

## 2020-10-04 NOTE — Telephone Encounter (Signed)
Sent my chart message to schedule appointment.

## 2020-10-12 NOTE — Telephone Encounter (Signed)
Left Voice Mail.

## 2020-10-12 NOTE — Telephone Encounter (Signed)
Pt made appt for July 26th Med Check

## 2020-12-13 ENCOUNTER — Other Ambulatory Visit: Payer: Self-pay | Admitting: Family Medicine

## 2021-01-25 ENCOUNTER — Other Ambulatory Visit: Payer: Self-pay

## 2021-01-25 ENCOUNTER — Ambulatory Visit: Payer: BLUE CROSS/BLUE SHIELD | Admitting: Family Medicine

## 2021-01-25 VITALS — BP 112/70 | Temp 97.2°F | Wt 189.6 lb

## 2021-01-25 DIAGNOSIS — F325 Major depressive disorder, single episode, in full remission: Secondary | ICD-10-CM

## 2021-01-25 DIAGNOSIS — Z79899 Other long term (current) drug therapy: Secondary | ICD-10-CM

## 2021-01-25 DIAGNOSIS — Z1211 Encounter for screening for malignant neoplasm of colon: Secondary | ICD-10-CM | POA: Diagnosis not present

## 2021-01-25 DIAGNOSIS — K219 Gastro-esophageal reflux disease without esophagitis: Secondary | ICD-10-CM | POA: Diagnosis not present

## 2021-01-25 DIAGNOSIS — E785 Hyperlipidemia, unspecified: Secondary | ICD-10-CM | POA: Diagnosis not present

## 2021-01-25 MED ORDER — PRAVASTATIN SODIUM 80 MG PO TABS: 80.0000 mg | ORAL_TABLET | Freq: Every day | ORAL | 12 refills | Status: DC

## 2021-01-25 MED ORDER — ESCITALOPRAM OXALATE 20 MG PO TABS: 20.0000 mg | ORAL_TABLET | Freq: Every day | ORAL | 12 refills | Status: DC

## 2021-01-25 MED ORDER — PANTOPRAZOLE SODIUM 40 MG PO TBEC: 40.0000 mg | DELAYED_RELEASE_TABLET | Freq: Every day | ORAL | 12 refills | Status: DC

## 2021-01-25 NOTE — Patient Instructions (Addendum)
(   Saxenda  Wygovy)    Shingrix and shingles prevention: know the facts!   Shingrix is a very effective vaccine to prevent shingles.   Shingles is a reactivation of chickenpox -more than 99% of Americans born before 1980 have had chickenpox even if they do not remember it. One in every 10 people who get shingles have severe long-lasting nerve pain as a result.   33 out of a 100 older adults will get shingles if they are unvaccinated.     This vaccine is very important for your health This vaccine is indicated for anyone 50 years or older. You can get this vaccine even if you have already had shingles because you can get the disease more than once in a lifetime.  Your risk for shingles and its complications increases with age.  This vaccine has 2 doses.  The second dose would be 2 to 6 months after the first dose.  If you had Zostavax vaccine in the past you should still get Shingrix. ( Zostavax is only 70% effective and it loses significant strength over a few years .)  This vaccine is given through the pharmacy.  The cost of the vaccine is through your insurance. The pharmacy can inform you of the total costs.  Common side effects including soreness in the arm, some redness and swelling, also some feel fatigue muscle soreness headache low-grade fever.  Side effects typically go away within 2 to 3 days. Remember-the pain from shingles can last a lifetime but these side effects of the vaccine will only last a few days at most. It is very important to get both doses in order to protect yourself fully.   Please get this vaccine at your earliest convenience at your trusted pharmacy.

## 2021-01-25 NOTE — Progress Notes (Signed)
   Subjective:    Patient ID: [name], female    DOB: 04-28-1961, 60 y.o.   MRN: [id number]  HPI Pt here for follow up. States she is doing well. No issues with meds.   Patient here for follow-up regarding cholesterol.  The patient does have hyperlipidemia.  Patient does try to maintain a reasonable diet.  Patient does take the medication on a regular basis.  Denies missing a dose.  The patient denies any obvious side effects.  Prior blood work results reviewed with the patient.  The patient is aware of his cholesterol goals and the need to keep it under good control to lessen the risk of disease.  Patient has very frustrated by her weight gain She thinks genetically women tend to be bigger in her family.  In addition she does also state strokes runs in the family.  Patient relates her moods are doing well   Review of Systems     Objective:   Physical Exam General-in no acute distress Eyes-no discharge Lungs-respiratory rate normal, CTA CV-no murmurs,RRR Extremities skin warm dry no edema Neuro grossly normal Behavior normal, alert         Assessment & Plan:  1. Gastroesophageal reflux disease without esophagitis Relates medication necessary to keep her symptoms under control without taking it she has severe reflux issues - pantoprazole (PROTONIX) 40 MG tablet; Take 1 tablet (40 mg total) by mouth daily.  Dispense: 30 tablet; Refill: 12  2. High risk medication use Check liver functions - Basic Metabolic Panel (BMET) - Hepatic function panel - Lipid Profile  3. Hyperlipidemia, unspecified hyperlipidemia type History of hyperlipidemia.  Healthy diet regular activity recommended.  May need to be on statin component depending on results - Basic Metabolic Panel (BMET) - Hepatic function panel - Lipid Profile  4. Encounter for screening colonoscopy Patient due for colonoscopy needs to be through someone that her insurance covers - Ambulatory referral to  Gastroenterology  5. Depression, major, single episode, complete remission ([place]) She would like to continue current medication. - escitalopram (LEXAPRO) 20 MG tablet; Take 1 tablet (20 mg total) by mouth daily.  Dispense: 30 tablet; Refill: 12

## 2021-01-26 LAB — BASIC METABOLIC PANEL
BUN/Creatinine Ratio: 18 (ref 9–23)
BUN: 16 mg/dL (ref 6–24)
CO2: 23 mmol/L (ref 20–29)
Calcium: 9.6 mg/dL (ref 8.7–10.2)
Chloride: 103 mmol/L (ref 96–106)
Creatinine, Ser: 0.9 mg/dL (ref 0.57–1.00)
Glucose: 110 mg/dL — ABNORMAL HIGH (ref 65–99)
Potassium: 4.8 mmol/L (ref 3.5–5.2)
Sodium: 142 mmol/L (ref 134–144)
eGFR: 74 mL/min/{1.73_m2} (ref >59)

## 2021-01-26 LAB — LIPID PANEL
Chol/HDL Ratio: 4.6 ratio — ABNORMAL HIGH (ref 0.0–4.4)
Cholesterol, Total: 252 mg/dL — ABNORMAL HIGH (ref 100–199)
HDL: 55 mg/dL (ref >39)
LDL Chol Calc (NIH): 152 mg/dL — ABNORMAL HIGH (ref 0–99)
Triglycerides: 245 mg/dL — ABNORMAL HIGH (ref 0–149)
VLDL Cholesterol Cal: 45 mg/dL — ABNORMAL HIGH (ref 5–40)

## 2021-01-26 LAB — HEPATIC FUNCTION PANEL
ALT: 25 IU/L (ref 0–32)
AST: 22 IU/L (ref 0–40)
Albumin: 4.8 g/dL (ref 3.8–4.9)
Alkaline Phosphatase: 103 IU/L (ref 44–121)
Bilirubin Total: 0.4 mg/dL (ref 0.0–1.2)
Bilirubin, Direct: <0.1 mg/dL (ref 0.00–0.40)
Total Protein: 7.4 g/dL (ref 6.0–8.5)

## 2021-02-01 ENCOUNTER — Other Ambulatory Visit: Payer: Self-pay | Admitting: Family Medicine

## 2021-02-01 DIAGNOSIS — Z79899 Other long term (current) drug therapy: Secondary | ICD-10-CM

## 2021-02-01 DIAGNOSIS — E785 Hyperlipidemia, unspecified: Secondary | ICD-10-CM

## 2021-02-01 NOTE — Progress Notes (Signed)
error 

## 2021-02-02 ENCOUNTER — Other Ambulatory Visit: Payer: Self-pay | Admitting: Family Medicine

## 2021-02-02 DIAGNOSIS — E785 Hyperlipidemia, unspecified: Secondary | ICD-10-CM

## 2021-02-02 DIAGNOSIS — R7301 Impaired fasting glucose: Secondary | ICD-10-CM

## 2021-02-02 DIAGNOSIS — Z79899 Other long term (current) drug therapy: Secondary | ICD-10-CM

## 2021-02-02 MED ORDER — ROSUVASTATIN CALCIUM 10 MG PO TABS
ORAL_TABLET | ORAL | 1 refills | Status: DC
Start: 2021-02-02 — End: 2021-10-19

## 2021-03-24 ENCOUNTER — Encounter: Payer: Self-pay | Admitting: Family Medicine

## 2021-10-19 ENCOUNTER — Other Ambulatory Visit: Payer: Self-pay | Admitting: Family Medicine

## 2022-02-07 ENCOUNTER — Telehealth: Payer: Self-pay | Admitting: *Deleted

## 2022-02-07 DIAGNOSIS — Z79899 Other long term (current) drug therapy: Secondary | ICD-10-CM

## 2022-02-07 DIAGNOSIS — R7301 Impaired fasting glucose: Secondary | ICD-10-CM

## 2022-02-07 DIAGNOSIS — K219 Gastro-esophageal reflux disease without esophagitis: Secondary | ICD-10-CM

## 2022-02-07 DIAGNOSIS — E785 Hyperlipidemia, unspecified: Secondary | ICD-10-CM

## 2022-02-07 DIAGNOSIS — I10 Essential (primary) hypertension: Secondary | ICD-10-CM

## 2022-02-07 DIAGNOSIS — F325 Major depressive disorder, single episode, in full remission: Secondary | ICD-10-CM

## 2022-02-07 MED ORDER — ESCITALOPRAM OXALATE 20 MG PO TABS
20.0000 mg | ORAL_TABLET | Freq: Every day | ORAL | 0 refills | Status: DC
Start: 2022-02-07 — End: 2022-03-07

## 2022-02-07 MED ORDER — PANTOPRAZOLE SODIUM 40 MG PO TBEC
40.0000 mg | DELAYED_RELEASE_TABLET | Freq: Every day | ORAL | 0 refills | Status: DC
Start: 2022-02-07 — End: 2022-03-07

## 2022-02-07 NOTE — Telephone Encounter (Signed)
Patient wanted to know if she needs labs for upcoming appt. Last labs 12/2020- Lipid, liver and met 7

## 2022-02-08 NOTE — Telephone Encounter (Signed)
Lipid, liver, metabolic 7, V4F Hyperlipidemia, hyperglycemia

## 2022-02-09 NOTE — Telephone Encounter (Signed)
Lab orders placed. Left message to return call  

## 2022-02-09 NOTE — Telephone Encounter (Signed)
Patient notified

## 2022-03-04 LAB — LIPID PANEL
Chol/HDL Ratio: 3.7 ratio (ref 0.0–4.4)
Cholesterol, Total: 199 mg/dL (ref 100–199)
HDL: 54 mg/dL (ref >39)
LDL Chol Calc (NIH): 124 mg/dL — ABNORMAL HIGH (ref 0–99)
Triglycerides: 120 mg/dL (ref 0–149)
VLDL Cholesterol Cal: 21 mg/dL (ref 5–40)

## 2022-03-04 LAB — HEPATIC FUNCTION PANEL
ALT: 17 IU/L (ref 0–32)
AST: 16 IU/L (ref 0–40)
Albumin: 4.4 g/dL (ref 3.8–4.9)
Alkaline Phosphatase: 73 IU/L (ref 44–121)
Bilirubin Total: 0.3 mg/dL (ref 0.0–1.2)
Bilirubin, Direct: <0.1 mg/dL (ref 0.00–0.40)
Total Protein: 6.8 g/dL (ref 6.0–8.5)

## 2022-03-04 LAB — BASIC METABOLIC PANEL
BUN/Creatinine Ratio: 21 (ref 12–28)
BUN: 18 mg/dL (ref 8–27)
CO2: 22 mmol/L (ref 20–29)
Calcium: 9.4 mg/dL (ref 8.7–10.3)
Chloride: 104 mmol/L (ref 96–106)
Creatinine, Ser: 0.85 mg/dL (ref 0.57–1.00)
Glucose: 100 mg/dL — ABNORMAL HIGH (ref 70–99)
Potassium: 4.4 mmol/L (ref 3.5–5.2)
Sodium: 141 mmol/L (ref 134–144)
eGFR: 78 mL/min/{1.73_m2} (ref >59)

## 2022-03-04 LAB — HEMOGLOBIN A1C
Est. average glucose Bld gHb Est-mCnc: 111 mg/dL
Hgb A1c MFr Bld: 5.5 % (ref 4.8–5.6)

## 2022-03-07 ENCOUNTER — Ambulatory Visit (INDEPENDENT_AMBULATORY_CARE_PROVIDER_SITE_OTHER): Payer: Self-pay | Admitting: Family Medicine

## 2022-03-07 VITALS — BP 116/73 | HR 70 | Temp 97.5°F | Ht 66.0 in | Wt 177.0 lb

## 2022-03-07 DIAGNOSIS — F325 Major depressive disorder, single episode, in full remission: Secondary | ICD-10-CM

## 2022-03-07 DIAGNOSIS — Z1211 Encounter for screening for malignant neoplasm of colon: Secondary | ICD-10-CM

## 2022-03-07 DIAGNOSIS — K219 Gastro-esophageal reflux disease without esophagitis: Secondary | ICD-10-CM

## 2022-03-07 MED ORDER — ESCITALOPRAM OXALATE 20 MG PO TABS: 20.0000 mg | ORAL_TABLET | Freq: Every day | ORAL | 3 refills | Status: DC

## 2022-03-07 MED ORDER — ROSUVASTATIN CALCIUM 10 MG PO TABS
ORAL_TABLET | ORAL | 3 refills | Status: DC
Start: 2022-03-07 — End: 2023-05-14

## 2022-03-07 MED ORDER — PANTOPRAZOLE SODIUM 40 MG PO TBEC: 40.0000 mg | DELAYED_RELEASE_TABLET | Freq: Every day | ORAL | 3 refills | Status: DC

## 2022-03-07 NOTE — Progress Notes (Signed)
   Subjective:    Patient ID: [name], female    DOB: Feb 06, 1961, 61 y.o.   MRN: [id number]  Hyperlipidemia This is a chronic problem. Treatments tried: rousuvastatin.  Follow up for chronic medical concerns We did discuss getting a mammogram on a yearly basis she will check with her insurance to see where they will cover We will also reinitiate gastroenterology referral for colonoscopy Patient is exercising watching diet trying to bring her weight down    Review of Systems     Objective:   Physical Exam Lungs clear heart regular HEENT benign  Patient denies any skin lesions or changes recently she tries to do a good job of using sunscreen     Assessment & Plan:  1. Gastroesophageal reflux disease without esophagitis She states she does very well when she takes this medicine if she does not take this medicine she has trouble - pantoprazole (PROTONIX) 40 MG tablet; Take 1 tablet (40 mg total) by mouth daily.  Dispense: 90 tablet; Refill: 3 - Ambulatory referral to Gastroenterology  2. Depression, major, single episode, complete remission ([place]) Takes her medicine well denies any setbacks or problems - escitalopram (LEXAPRO) 20 MG tablet; Take 1 tablet (20 mg total) by mouth daily.  Dispense: 90 tablet; Refill: 3 - Ambulatory referral to Gastroenterology  Referral to [facility] gastroenterology for colon cancer screening

## 2022-03-20 ENCOUNTER — Encounter: Payer: Self-pay | Admitting: Family Medicine

## 2022-03-20 DIAGNOSIS — D229 Melanocytic nevi, unspecified: Secondary | ICD-10-CM

## 2022-03-20 NOTE — Telephone Encounter (Signed)
Nurses-please go ahead with urgent referral for this mole And notify Debbie as well thank you-Dr. Nicki Reaper

## 2022-03-29 ENCOUNTER — Encounter: Payer: Self-pay | Admitting: Family Medicine

## 2022-04-17 ENCOUNTER — Encounter: Payer: Self-pay | Admitting: Family Medicine

## 2022-08-07 ENCOUNTER — Ambulatory Visit: Payer: BLUE CROSS/BLUE SHIELD | Admitting: Family Medicine

## 2022-08-07 VITALS — BP 112/72 | Wt 192.2 lb

## 2022-08-07 DIAGNOSIS — F325 Major depressive disorder, single episode, in full remission: Secondary | ICD-10-CM

## 2022-08-07 DIAGNOSIS — M25512 Pain in left shoulder: Secondary | ICD-10-CM | POA: Diagnosis not present

## 2022-08-07 DIAGNOSIS — M25612 Stiffness of left shoulder, not elsewhere classified: Secondary | ICD-10-CM | POA: Diagnosis not present

## 2022-08-07 MED ORDER — DICLOFENAC SODIUM 75 MG PO TBEC: 75.0000 mg | DELAYED_RELEASE_TABLET | Freq: Two times a day (BID) | ORAL | 1 refills | Status: DC

## 2022-08-07 NOTE — Progress Notes (Addendum)
   Subjective:    Patient ID: MEGHANA TULLO, female    DOB: 1961-05-18, 62 y.o.   MRN: [id number] This verifies that the history review of any tests, physical exam, assessment and plan were conducted by Dr. Sallee Lange and documented accordingly by him today Sallee Lange MD primary care [facility]  HPI Patient arrives today with left shoulder pain.Started six weeks ago. Left shoulder pain discomfort.  Hurts with certain movements.  Internal rotation and lifting the shoulder above clavicle height causes pain also proximal shoulder pain with movements where clavicle and shoulder blade come together on the distal aspect.  Denies any injury.  Has had frozen shoulder before.  Doing some exercises.    Review of Systems     Objective:   Physical Exam  Lungs clear heart regular shoulder moderate tenderness with certain movements and rotations.  I feel the patient has a strain of the rotator cuff but I do not find evidence of a tear      Assessment & Plan:  1. Depression, major, single episode, complete remission ([place]) Under good care taking medicine tolerating well no depression currently  Left shoulder pain for several weeks decreased range of motion Rotator cuff strain possible Recommend referral to orthopedics She needs to see [facility] orthopedics because of her insurance Anti-inflammatories and exercises were shown to the patient follow-up if ongoing troubles

## 2022-08-08 NOTE — Addendum Note (Signed)
Addended by: Vicente Males on: 08/08/2022 10:23 AM   Modules accepted: Orders

## 2022-08-08 NOTE — Progress Notes (Signed)
08/08/22-Referral placed

## 2023-03-01 ENCOUNTER — Telehealth: Payer: Self-pay | Admitting: Family Medicine

## 2023-03-01 NOTE — Telephone Encounter (Signed)
I would recommend lipid, liver, metabolic 7, ferritin, TIBC High risk medication, hyperlipidemia, elevated ferritin, wellness

## 2023-03-01 NOTE — Telephone Encounter (Signed)
Patient has physical 10/31 and requesting labs

## 2023-03-02 ENCOUNTER — Other Ambulatory Visit: Payer: Self-pay

## 2023-03-02 DIAGNOSIS — Z79899 Other long term (current) drug therapy: Secondary | ICD-10-CM

## 2023-03-02 DIAGNOSIS — I10 Essential (primary) hypertension: Secondary | ICD-10-CM

## 2023-03-02 DIAGNOSIS — E785 Hyperlipidemia, unspecified: Secondary | ICD-10-CM

## 2023-03-02 DIAGNOSIS — R7989 Other specified abnormal findings of blood chemistry: Secondary | ICD-10-CM

## 2023-03-02 NOTE — Telephone Encounter (Signed)
Called pt to inform of labs being ordered, no answer left message for call back

## 2023-04-13 LAB — BASIC METABOLIC PANEL
BUN/Creatinine Ratio: 26 (ref 12–28)
BUN: 23 mg/dL (ref 8–27)
CO2: 22 mmol/L (ref 20–29)
Calcium: 9.3 mg/dL (ref 8.7–10.3)
Chloride: 104 mmol/L (ref 96–106)
Creatinine, Ser: 0.88 mg/dL (ref 0.57–1.00)
Glucose: 103 mg/dL — ABNORMAL HIGH (ref 70–99)
Potassium: 5 mmol/L (ref 3.5–5.2)
Sodium: 140 mmol/L (ref 134–144)
eGFR: 75 mL/min/{1.73_m2} (ref >59)

## 2023-04-13 LAB — HEPATIC FUNCTION PANEL
ALT: 22 [IU]/L (ref 0–32)
AST: 22 [IU]/L (ref 0–40)
Albumin: 4.5 g/dL (ref 3.9–4.9)
Alkaline Phosphatase: 85 [IU]/L (ref 44–121)
Bilirubin Total: 0.4 mg/dL (ref 0.0–1.2)
Bilirubin, Direct: <0.1 mg/dL (ref 0.00–0.40)
Total Protein: 7.2 g/dL (ref 6.0–8.5)

## 2023-04-13 LAB — LIPID PANEL
Chol/HDL Ratio: 5.2 {ratio} — ABNORMAL HIGH (ref 0.0–4.4)
Cholesterol, Total: 309 mg/dL — ABNORMAL HIGH (ref 100–199)
HDL: 59 mg/dL (ref >39)
LDL Chol Calc (NIH): 225 mg/dL — ABNORMAL HIGH (ref 0–99)
Triglycerides: 136 mg/dL (ref 0–149)
VLDL Cholesterol Cal: 25 mg/dL (ref 5–40)

## 2023-04-13 LAB — IRON,TIBC AND FERRITIN PANEL
Ferritin: 332 ng/mL — ABNORMAL HIGH (ref 15–150)
Iron Saturation: 38 % (ref 15–55)
Iron: 121 ug/dL (ref 27–139)
Total Iron Binding Capacity: 317 ug/dL (ref 250–450)
UIBC: 196 ug/dL (ref 118–369)

## 2023-04-20 ENCOUNTER — Other Ambulatory Visit: Payer: Self-pay | Admitting: Family Medicine

## 2023-04-20 DIAGNOSIS — F325 Major depressive disorder, single episode, in full remission: Secondary | ICD-10-CM

## 2023-04-20 DIAGNOSIS — K219 Gastro-esophageal reflux disease without esophagitis: Secondary | ICD-10-CM

## 2023-05-03 ENCOUNTER — Encounter: Payer: BLUE CROSS/BLUE SHIELD | Admitting: Family Medicine

## 2023-05-14 ENCOUNTER — Encounter: Payer: Self-pay | Admitting: Family Medicine

## 2023-05-14 ENCOUNTER — Ambulatory Visit: Payer: BLUE CROSS/BLUE SHIELD | Admitting: Family Medicine

## 2023-05-14 VITALS — BP 135/81 | HR 61 | Ht 66.0 in | Wt 189.0 lb

## 2023-05-14 DIAGNOSIS — K219 Gastro-esophageal reflux disease without esophagitis: Secondary | ICD-10-CM

## 2023-05-14 DIAGNOSIS — E785 Hyperlipidemia, unspecified: Secondary | ICD-10-CM

## 2023-05-14 DIAGNOSIS — Z0000 Encounter for general adult medical examination without abnormal findings: Secondary | ICD-10-CM

## 2023-05-14 DIAGNOSIS — F325 Major depressive disorder, single episode, in full remission: Secondary | ICD-10-CM | POA: Diagnosis not present

## 2023-05-14 DIAGNOSIS — Z0001 Encounter for general adult medical examination with abnormal findings: Secondary | ICD-10-CM

## 2023-05-14 DIAGNOSIS — Z79899 Other long term (current) drug therapy: Secondary | ICD-10-CM

## 2023-05-14 DIAGNOSIS — Z23 Encounter for immunization: Secondary | ICD-10-CM

## 2023-05-14 MED ORDER — ROSUVASTATIN CALCIUM 10 MG PO TABS: ORAL_TABLET | ORAL | 3 refills | Status: DC

## 2023-05-14 MED ORDER — PANTOPRAZOLE SODIUM 40 MG PO TBEC: 40.0000 mg | DELAYED_RELEASE_TABLET | Freq: Every day | ORAL | 3 refills | Status: DC

## 2023-05-14 MED ORDER — ESCITALOPRAM OXALATE 20 MG PO TABS: 20.0000 mg | ORAL_TABLET | Freq: Every day | ORAL | 3 refills | Status: DC

## 2023-05-14 NOTE — Progress Notes (Signed)
   Subjective:    Patient ID: [name], female    DOB: 12/19/1960, 62 y.o.   MRN: [id number]  HPI The patient comes in today for a wellness visit.    A review of their health history was completed.  A review of medications was also completed.  Any needed refills; no  Eating habits: Fair  Falls/  MVA accidents in past few months: No  Regular exercise: Yes, strength training and cardio 3 to 4 times a week  Specialist pt sees on regular basis: mental therapist, getting new GYN   Preventative health issues were discussed.   Additional concerns: no concerns or issues   The 10-year ASCVD risk score (Arnett DK, et al., 2019) is: 5.3%   Values used to calculate the score:     Age: 62 years     Sex: Female     Is Non-Hispanic African American: No     Diabetic: No     Tobacco smoker: No     Systolic Blood Pressure: 135 mmHg     Is BP treated: No     HDL Cholesterol: 59 mg/dL     Total Cholesterol: 309 mg/dL   Review of Systems     Objective:   Physical Exam  General-in no acute distress Eyes-no discharge Lungs-respiratory rate normal, CTA CV-no murmurs,RRR Extremities skin warm dry no edema Neuro grossly normal Behavior normal, alert Abdomen soft no masses felt, examined through clothing      Assessment & Plan:  Patient states she is going to work on her weight by trying to eat healthier and portion control  She will continue to do exercise on a regular basis  Adult wellness-complete.wellness physical was conducted today. Importance of diet and exercise were discussed in detail.  Importance of stress reduction and healthy living were discussed.  In addition to this a discussion regarding safety was also covered.  We also reviewed over immunizations and gave recommendations regarding current immunization needed for age.   In addition to this additional areas were also touched on including: Preventative health exams needed:  Colonoscopy  2029  Patient was advised yearly wellness exam Gets gynecologic exam and female checkup through her gynecologist up-to-date on mammogram and  Has significant hyperlipidemia and she agrees to restart statins Doing well on Lexapro we will continue this Uses pantoprazole for acid reflux has symptoms if she does not take  She will do lipid liver in approximately 10 to 12 weeks

## 2023-05-14 NOTE — Addendum Note (Signed)
Addended by: Elizbeth Squires on: 05/14/2023 01:06 PM   Modules accepted: Orders

## 2023-06-25 ENCOUNTER — Encounter: Payer: Self-pay | Admitting: Family Medicine

## 2023-06-25 NOTE — Telephone Encounter (Signed)
Hi [name]  I appreciate the message Certainly your message will remain confidential  Unfortunately there is not a blood test that looks at alcohol drinking per se  Typically in a situation like this I will see how a person is doing healthwise, touch base on compliance with medications, touch base on dietary measures and also healthy choices such as exercise, alcohol use, etc.  The difficult part-some individuals (even when we tried to bring up these topics in a respectful way)-will brush aside talking about the topics.  I have had some individuals come to the office with their spouse in order to discuss certain topics more openly.

## 2024-03-24 ENCOUNTER — Encounter: Payer: Self-pay | Admitting: Family Medicine

## 2024-03-24 NOTE — Telephone Encounter (Signed)
 Nurses There is additional medication that can be added to what she is taking Please help set her up with a same-day slot with me this week or next week so I can talk with her about this and go over medication options thank you-Dr. Glendia

## 2024-04-09 ENCOUNTER — Ambulatory Visit (INDEPENDENT_AMBULATORY_CARE_PROVIDER_SITE_OTHER): Payer: Self-pay | Admitting: Family Medicine

## 2024-04-09 VITALS — BP 117/74 | HR 62 | Temp 97.8°F | Ht 66.0 in | Wt 190.0 lb

## 2024-04-09 DIAGNOSIS — R4589 Other symptoms and signs involving emotional state: Secondary | ICD-10-CM

## 2024-04-09 DIAGNOSIS — R400 Somnolence: Secondary | ICD-10-CM

## 2024-04-09 DIAGNOSIS — Z23 Encounter for immunization: Secondary | ICD-10-CM

## 2024-04-09 DIAGNOSIS — F411 Generalized anxiety disorder: Secondary | ICD-10-CM

## 2024-04-09 MED ORDER — BUSPIRONE HCL 5 MG PO TABS: ORAL_TABLET | ORAL | 5 refills | Status: AC

## 2024-04-09 MED ORDER — FLUOXETINE HCL 20 MG PO CAPS: 20.0000 mg | ORAL_CAPSULE | Freq: Every day | ORAL | 1 refills | Status: AC

## 2024-04-09 NOTE — Progress Notes (Signed)
   Subjective:    Patient ID: [name], female    DOB: 12-01-1960, 63 y.o.   MRN: [id number]  HPI Discuss lexapro  medication and other treatment options for mod to severe depression and anxiety at times w driving Patient feels that Lexapro  is no longer working well She finds herself feeling stressed anxious on edge sometimes quick to get angry in addition to this at times she says things that she does not want to say then she has to apologize for them She is trying to do better It has just been a rough go She states there is no specific triggers is just the way she is feeling She does find yourself feeling depressed at times but denies feeling suicidal    04/09/2024    1:49 PM 05/14/2023    9:54 AM 08/07/2022    9:07 AM  PHQ9 SCORE ONLY  PHQ-9 Total Score 5 0  2      Data saved with a previous flowsheet row definition      04/09/2024    1:50 PM 05/14/2023    9:54 AM 08/07/2022    9:08 AM  GAD 7 : Generalized Anxiety Score  Nervous, Anxious, on Edge 1 0 0  Control/stop worrying 0 0 0  Worry too much - different things 0 0 0  Trouble relaxing 1 0 0  Restless 0 0 0  Easily annoyed or irritable 2 0 0  Afraid - awful might happen 0 0 0  Total GAD 7 Score 4 0 0  Anxiety Difficulty Somewhat difficult     Patient does relate a lot of fatigue tiredness during the day and she states her husband does state that she snores at night and in addition to this she feels rundown like she has little energy and feels tired during the day   Review of Systems     Objective:   Physical Exam  Lungs clear heart regular pulse normal      Assessment & Plan:  1. Immunization due Today - Flu vaccine trivalent PF, 6mos and older(Flulaval,Afluria,Fluarix,Fluzone)  2. GAD (generalized anxiety disorder) (Primary) We will go ahead and switch medications to stop Lexapro  Start Prozac Start off with 20 mg Patient to give us  feedback 3 to 4 weeks May need to adjust dosing depending on how  things go May use BuSpar twice daily as needed  3. Depressed mood Lexapro  stop Start Prozac Hold off on counseling but if she does not improve with the second medicine consider genetic testing regarding her medication sensitivities and also consider consultation with behavioral health  4. Daytime somnolence Sleep apnea evaluation would be reasonable-patient states she will send us  information regarding her insurance so we can utilize someone within network the last time we went through some of this with her insurance it was [facility].  Await the information from the patient before proceeding with any type of referral  Follow-up within several months but give us  a MyChart update within a few weeks

## 2024-07-18 ENCOUNTER — Other Ambulatory Visit: Payer: Self-pay | Admitting: Family Medicine

## 2024-07-18 DIAGNOSIS — K219 Gastro-esophageal reflux disease without esophagitis: Secondary | ICD-10-CM

## 2024-08-11 ENCOUNTER — Ambulatory Visit: Payer: Self-pay | Admitting: Family Medicine
